# Patient Record
Sex: Female | Born: 1960 | Race: White | Hispanic: No | Marital: Married | Smoking: Never smoker
Health system: Southern US, Community
[De-identification: ages and names within clinical notes are randomized; demographics above are authoritative.]

## PROBLEM LIST (undated history)

## (undated) DIAGNOSIS — R3121 Asymptomatic microscopic hematuria: Secondary | ICD-10-CM

## (undated) DIAGNOSIS — Z8742 Personal history of other diseases of the female genital tract: Secondary | ICD-10-CM

## (undated) DIAGNOSIS — Z9889 Other specified postprocedural states: Secondary | ICD-10-CM

## (undated) HISTORY — DX: Asymptomatic microscopic hematuria: R31.21

## (undated) HISTORY — DX: Other specified postprocedural states: Z87.42

## (undated) HISTORY — DX: Personal history of other diseases of the female genital tract: Z98.890

---

## 2000-10-28 ENCOUNTER — Other Ambulatory Visit: Admission: RE | Admit: 2000-10-28 | Discharge: 2000-10-28 | Payer: Self-pay | Admitting: *Deleted

## 2000-12-17 HISTORY — PX: DILATION AND CURETTAGE OF UTERUS: SHX78

## 2001-01-09 ENCOUNTER — Ambulatory Visit (HOSPITAL_COMMUNITY): Admission: RE | Admit: 2001-01-09 | Discharge: 2001-01-09 | Payer: Self-pay | Admitting: *Deleted

## 2011-08-28 ENCOUNTER — Encounter: Payer: Self-pay | Admitting: Family Medicine

## 2011-08-28 ENCOUNTER — Ambulatory Visit (INDEPENDENT_AMBULATORY_CARE_PROVIDER_SITE_OTHER): Payer: No Typology Code available for payment source | Admitting: Family Medicine

## 2011-08-28 DIAGNOSIS — Z780 Asymptomatic menopausal state: Secondary | ICD-10-CM | POA: Insufficient documentation

## 2011-08-28 DIAGNOSIS — Z01419 Encounter for gynecological examination (general) (routine) without abnormal findings: Secondary | ICD-10-CM

## 2011-08-28 DIAGNOSIS — Z124 Encounter for screening for malignant neoplasm of cervix: Secondary | ICD-10-CM

## 2011-08-28 DIAGNOSIS — N951 Menopausal and female climacteric states: Secondary | ICD-10-CM

## 2011-08-28 MED ORDER — MEDROXYPROGESTERONE ACETATE 2.5 MG PO TABS: 2.5000 mg | ORAL_TABLET | Freq: Every day | ORAL | Status: DC

## 2011-08-28 MED ORDER — ESTROGENS, CONJUGATED 0.625 MG/GM VA CREA: TOPICAL_CREAM | Freq: Every day | VAGINAL | Status: AC

## 2011-08-28 NOTE — Progress Notes (Signed)
  Subjective:     Karina Walsh is a 50 y.o. female and is here for a comprehensive physical exam. The patient reports problems - menopausal symptoms with vaginal dryness, no cycle x 2 yrs, painful intercourse and some hot flashes..  History   Social History  . Marital Status: Married    Spouse Name: N/A    Number of Children: N/A  . Years of Education: N/A   Occupational History  . Not on file.   Social History Main Topics  . Smoking status: Passive Smoker    Types: Cigarettes  . Smokeless tobacco: Not on file  . Alcohol Use: No  . Drug Use: No  . Sexually Active: Not on file   Other Topics Concern  . Not on file   Social History Narrative  . No narrative on file   No health maintenance topics applied.  The following portions of the patient's history were reviewed and updated as appropriate: allergies, current medications, past family history, past medical history, past social history, past surgical history and problem list.  Review of Systems A comprehensive review of systems was negative.   Objective:    BP 109/71  Pulse 79  Ht 5\' 3"  (1.6 m)  Wt 132 lb (59.875 kg)  BMI 23.38 kg/m2 General appearance: alert, cooperative and appears stated age Neck: no adenopathy, supple, symmetrical, trachea midline and thyroid not enlarged, symmetric, no tenderness/mass/nodules Back: negative, symmetric, no curvature. ROM normal. No CVA tenderness. Lungs: clear to auscultation bilaterally Breasts: normal appearance, no masses or tenderness Heart: regular rate and rhythm, S1, S2 normal, no murmur, click, rub or gallop Abdomen: soft, non-tender; bowel sounds normal; no masses,  no organomegaly Pelvic: cervix normal in appearance, external genitalia normal, no adnexal masses or tenderness, no cervical motion tenderness, uterus normal size, shape, and consistency and vagina normal without discharge Extremities: extremities normal, atraumatic, no cyanosis or edema Pulses: 2+ and  symmetric Skin: Skin color, texture, turgor normal. No rashes or lesions Lymph nodes: Cervical, supraclavicular, and axillary nodes normal. Neurologic: Grossly normal    Assessment:    Healthy female exam. Menopause     Plan:    Pap smear, mammogram, Blood work Trial of premarin and provera. See After Visit Summary for Counseling Recommendations

## 2011-08-28 NOTE — Progress Notes (Signed)
Here for yearly exam and to discuss HRT.

## 2011-08-28 NOTE — Patient Instructions (Signed)
Insomnia Insomnia is frequent trouble falling and/or staying asleep. Insomnia can be a long term problem or a short term problem. Both are common. Insomnia can be a short term problem when the wakefulness is related to a certain stress or worry. Long term insomnia is often related to ongoing stress during waking hours and/or poor sleeping habits. Overtime, sleep deprivation itself can make the problem worse. Every little thing feels more severe because you are overtired and your ability to cope is decreased. SYMPTOMS  Not feeling rested in the morning.   Anxiety and restlessness at bedtime.   Difficulty falling and staying asleep.  CAUSES  Stress, anxiety, and depression.   Poor sleeping habits.   Distractions such as TV in the bedroom.   Naps close to bedtime.   Engaging in emotionally charged conversations before bed.   Technical reading before sleep.   Alcohol and other sedatives. They may make the problem worse. They can hurt normal sleep patterns and normal dream activity.   Stimulants such as caffeine for several hours prior to bedtime.   Pain syndromes and shortness of breath can cause insomnia.   Exercise late at night.   Changing time zones may cause sleeping problems (jet lag).  It is sometimes helpful to have someone observe your sleeping patterns. They should look for periods of not breathing during the night (sleep apnea). They should also look to see how long those periods last. If you live alone or observers are uncertain, you can also be observed at a sleep clinic where your sleep patterns will be professionally monitored. Sleep apnea requires a checkup and treatment. Give your caregivers your medical history. Give your caregivers observations your family has made about your sleep.  TREATMENT  Your caregiver may prescribe treatment for an underlying medical disorders. Your caregiver can give advice or help if you are using alcohol or other drugs for self-medication.  Treatment of underlying problems will usually eliminate insomnia problems.   Medications can be prescribed for short time use. They are generally not recommended for lengthy use.   Over-the-counter sleep medicines are not recommended for lengthy use. They can be habit forming.   You can promote easier sleeping by making lifestyle changes such as:   Using relaxation techniques that help with breathing and reduce muscle tension.   Exercising earlier in the day.   Changing your diet and the time of your last meal. No night time snacks.   Establish a regular time to go to bed.   Counseling can help with stressful problems and worry.   Soothing music and white noise may be helpful if there are background noises you cannot remove.   Stop tedious detailed work at least one hour before bedtime.  HOME CARE INSTRUCTIONS  Keep a diary. Inform your caregiver about your progress. This includes any medication side effects. See your caregiver regularly. Take note of:   Times when you are asleep.  Times when you are awake during the night.   The quality of your sleep.  How you feel the next day.   This information will help your caregiver care for you.  Get out of bed if you are still awake after 15 minutes. Read or do some quiet activity. Keep the lights down. Wait until you feel sleepy and go back to bed.   Keep regular sleeping and waking hours. Avoid naps.   Exercise regularly.   Avoid distractions at bedtime. Distractions include watching television or engaging in any intense or detailed  activity like attempting to balance the household checkbook.   Develop a bedtime ritual. Keep a familiar routine of bathing, brushing your teeth, climbing into bed at the same time each night, listening to soothing music. Routines increase the success of falling to sleep faster.   Use relaxation techniques. This can be using breathing and muscle tension release routines. It can also include  visualizing peaceful scenes. You can also help control troubling or intruding thoughts by keeping your mind occupied with boring or repetitive thoughts like the old concept of counting sheep. You can make it more creative like imagining planting one beautiful flower after another in your backyard garden.   During your day, work to eliminate stress. When this is not possible use some of the previous suggestions to help reduce the anxiety that accompanies stressful situations.  MAKE SURE YOU:   Understand these instructions.   Will watch your condition.   Will get help right away if you are not doing well or get worse.  Document Released: 11/30/2000 Document Re-Released: 11/15/2008 [facility] [facility] Patient Information 2011 [place], [place].Menopause Menopause is the normal time of life when menstrual periods stop completely. Menopause is complete when you have missed 12 consecutive menstrual periods. It usually occurs between the ages of 75-55, with an average age of 7. Very rarely does a woman develop menopause before 50 years old. At menopause, your ovaries stop producing the female hormones, estrogen and progesterone. This can cause undesirable symptoms and also affect your health. Sometimes the symptoms may occur 4-5 years before the menopause begins. There is no relationship between oral contraceptives, number of children you had, race or the age your menstrual periods started (menarche). Heavy smokers and very thin women may develop menopause earlier in life. CAUSE  The ovaries stop producing the female hormones estrogen and progesterone.   Other causes include:   Surgery to remove both ovaries.  The ovaries stop functioning for no known reason.   Tumors of the pituitary gland in the brain.   Medical disease that affects the ovaries and hormone production.  Radiation treatment to the abdomen or pelvis.   Chemotherapy that affects the ovaries.   SYMPTOMS  Hot flashes.  Night sweats.    Decrease in sex drive.   Vaginal dryness and shrinking of the size of the genital organs causing painful intercourse.   Dryness of the skin and developing wrinkles.  Headaches.   Tiredness.   Irritability.   Memory problems.  Weight gain.   Bladder infections.   Hair growth of the face and chest.   Infertility.   More serious symptoms include:  Loss of bone (osteoporosis) causing breaks (fractures).   Depression.   Hardening and narrowing of the arteries (atherosclerosis) causing heart attacks and strokes.  DIAGNOSIS  When the menstrual periods have stopped for 12 straight months.   Physical exam.   Hormone studies of the blood.  TREATMENT There are many treatment choices and nearly as many questions about them. The decisions to treat or not to treat menopausal changes is an individual decision made with your caregiver. Your caregiver can discuss the treatments with you. Together, you can decide which treatment will work best for you such as:  Hormone replacement treatment.  Treat the individual symptoms with medication (ex. tranquilizer for depression).   Herbal medications that may help specific symptoms.  Counseling by a psychiatrist or psychologist.   Group therapy.   No treatment.   HOME CARE INSTRUCTIONS  Take the medication your caregiver gives you  as directed.   Get plenty of sleep and rest.   Exercise regularly.   Eat a diet that contains calcium (good for the bones) and soy products (acts like estrogen hormone).   Avoid alcoholic beverages.   Do not smoke.   Taking vitamin E may help in certain cases.   If you have hot flashes, dress in layers.   Take supplements, calcium and vitamin D to strengthen bones.   You can use over-the-counter vaginal cream for vaginal dryness.   Group therapy is sometimes very helpful.   Acupuncture may be helpful in some cases.  SEEK MEDICAL CARE IF:  You are not sure you are in the menopause.   You  are having menopausal symptoms and need advice and treatment.   You are still having menstrual periods after age 27.   You have pain with intercourse.   You are in the menopause (no menstrual period for 12 months) and develop vaginal bleeding.   You need a referral to a specialist (gynecologist, psychiatrist or psychologist) for treatment.  SEEK IMMEDIATE MEDICAL CARE IF:  You have severe depression.   You have excessive vaginal bleeding.   You fell and think you have a broken bone.   You have pain when you urinate.   You develop leg or chest pain.   You have a fast pounding heart beat (palpitations).   You have severe headaches.   You develop vision problems.   You feel a lump in your breast.   You have abdominal pain or severe indigestion.  Document Released: 02/23/2004 Document Re-Released: 10/05/2008 [facility] Patient Information 2011 [place], [place].

## 2011-08-31 ENCOUNTER — Other Ambulatory Visit (INDEPENDENT_AMBULATORY_CARE_PROVIDER_SITE_OTHER): Payer: No Typology Code available for payment source | Admitting: *Deleted

## 2011-08-31 DIAGNOSIS — N951 Menopausal and female climacteric states: Secondary | ICD-10-CM

## 2011-08-31 DIAGNOSIS — Z01419 Encounter for gynecological examination (general) (routine) without abnormal findings: Secondary | ICD-10-CM

## 2011-09-01 LAB — COMPREHENSIVE METABOLIC PANEL
ALT: 13 U/L (ref 0–35)
AST: 17 U/L (ref 0–37)
Chloride: 105 mEq/L (ref 96–112)
Creat: 0.97 mg/dL (ref 0.50–1.10)
Sodium: 140 mEq/L (ref 135–145)
Total Bilirubin: 0.9 mg/dL (ref 0.3–1.2)

## 2011-09-01 LAB — CBC WITH DIFFERENTIAL/PLATELET
Basophils Absolute: 0 10*3/uL (ref 0.0–0.1)
Eosinophils Absolute: 0.1 10*3/uL (ref 0.0–0.7)
Eosinophils Relative: 1 % (ref 0–5)
Lymphocytes Relative: 27 % (ref 12–46)
MCV: 93.1 fL (ref 78.0–100.0)
Neutrophils Relative %: 64 % (ref 43–77)
Platelets: 236 10*3/uL (ref 150–400)
RDW: 13.1 % (ref 11.5–15.5)
WBC: 7.1 10*3/uL (ref 4.0–10.5)

## 2011-09-01 LAB — LIPID PANEL
Cholesterol: 215 mg/dL — ABNORMAL HIGH (ref 0–200)
HDL: 49 mg/dL (ref >39)
Total CHOL/HDL Ratio: 4.4 Ratio
Triglycerides: 106 mg/dL (ref <150)

## 2011-09-02 ENCOUNTER — Encounter: Payer: Self-pay | Admitting: Family Medicine

## 2011-09-06 ENCOUNTER — Ambulatory Visit (HOSPITAL_COMMUNITY)
Admission: RE | Admit: 2011-09-06 | Discharge: 2011-09-06 | Disposition: A | Discharge status: Home / Self Care | Payer: No Typology Code available for payment source | Source: Ambulatory Visit | Attending: Family Medicine | Admitting: Family Medicine

## 2011-09-06 DIAGNOSIS — Z1231 Encounter for screening mammogram for malignant neoplasm of breast: Secondary | ICD-10-CM | POA: Insufficient documentation

## 2011-09-06 DIAGNOSIS — Z01419 Encounter for gynecological examination (general) (routine) without abnormal findings: Secondary | ICD-10-CM

## 2015-08-17 ENCOUNTER — Encounter: Payer: Self-pay | Admitting: Family Medicine

## 2015-08-17 ENCOUNTER — Other Ambulatory Visit (HOSPITAL_COMMUNITY)
Admission: RE | Admit: 2015-08-17 | Discharge: 2015-08-17 | Disposition: A | Discharge status: Home / Self Care | Payer: No Typology Code available for payment source | Source: Ambulatory Visit | Attending: Family Medicine | Admitting: Family Medicine

## 2015-08-17 ENCOUNTER — Ambulatory Visit (INDEPENDENT_AMBULATORY_CARE_PROVIDER_SITE_OTHER): Payer: No Typology Code available for payment source | Admitting: Family Medicine

## 2015-08-17 VITALS — BP 106/70 | HR 64 | Ht 63.0 in | Wt 136.2 lb

## 2015-08-17 DIAGNOSIS — Z1211 Encounter for screening for malignant neoplasm of colon: Secondary | ICD-10-CM

## 2015-08-17 DIAGNOSIS — Z83438 Family history of other disorder of lipoprotein metabolism and other lipidemia: Secondary | ICD-10-CM

## 2015-08-17 DIAGNOSIS — N951 Menopausal and female climacteric states: Secondary | ICD-10-CM

## 2015-08-17 DIAGNOSIS — Z0000 Encounter for general adult medical examination without abnormal findings: Secondary | ICD-10-CM | POA: Diagnosis not present

## 2015-08-17 DIAGNOSIS — Z01419 Encounter for gynecological examination (general) (routine) without abnormal findings: Secondary | ICD-10-CM | POA: Insufficient documentation

## 2015-08-17 DIAGNOSIS — N941 Dyspareunia: Secondary | ICD-10-CM | POA: Diagnosis not present

## 2015-08-17 DIAGNOSIS — Z7189 Other specified counseling: Secondary | ICD-10-CM

## 2015-08-17 DIAGNOSIS — Z8349 Family history of other endocrine, nutritional and metabolic diseases: Secondary | ICD-10-CM

## 2015-08-17 DIAGNOSIS — Z1151 Encounter for screening for human papillomavirus (HPV): Secondary | ICD-10-CM | POA: Diagnosis present

## 2015-08-17 DIAGNOSIS — Z7689 Persons encountering health services in other specified circumstances: Secondary | ICD-10-CM

## 2015-08-17 DIAGNOSIS — IMO0002 Reserved for concepts with insufficient information to code with codable children: Secondary | ICD-10-CM

## 2015-08-17 LAB — POCT URINALYSIS DIPSTICK
BILIRUBIN UA: NEGATIVE
GLUCOSE UA: NEGATIVE
KETONES UA: NEGATIVE
LEUKOCYTES UA: NEGATIVE
NITRITE UA: NEGATIVE
PH UA: 6
Protein, UA: NEGATIVE
Spec Grav, UA: 1.02
Urobilinogen, UA: NEGATIVE

## 2015-08-17 LAB — POCT UA - MICROSCOPIC ONLY

## 2015-08-17 NOTE — Patient Instructions (Addendum)
Remember to get a Hepatitis C screening test. We referred for colonoscopy and mammogram today.   Preventative Care for Adults - Female      MAINTAIN REGULAR HEALTH EXAMS:  A routine yearly physical is a good way to check in with your primary care provider about your health and preventive screening. It is also an opportunity to share updates about your health and any concerns you have, and receive a thorough all-over exam.   Most health insurance companies pay for at least some preventative services.  Check with your health plan for specific coverages.  WHAT PREVENTATIVE SERVICES DO WOMEN NEED?  Adult women should have their weight and blood pressure checked regularly.   Women age 39 and older should have their cholesterol levels checked regularly.  Women should be screened for cervical cancer with a Pap smear and pelvic exam beginning at either age 56, or 3 years after they become sexually activity.    Breast cancer screening generally begins at age 56 with a mammogram and breast exam by your primary care provider.    Beginning at age 69 and continuing to age 72, women should be screened for colorectal cancer.  Certain people may need continued testing until age 60.  Updating vaccinations is part of preventative care.  Vaccinations help protect against diseases such as the flu.  Osteoporosis is a disease in which the bones lose minerals and strength as we age. Women ages 71 and over should discuss this with their caregivers, as should women after menopause who have other risk factors.  Lab tests are generally done as part of preventative care to screen for anemia and blood disorders, to screen for problems with the kidneys and liver, to screen for bladder problems, to check blood sugar, and to check your cholesterol level.  Preventative services generally include counseling about diet, exercise, avoiding tobacco, drugs, excessive alcohol consumption, and sexually transmitted infections.     GENERAL RECOMMENDATIONS FOR GOOD HEALTH:  Healthy diet:  Eat a variety of foods, including fruit, vegetables, animal or vegetable protein, such as meat, fish, chicken, and eggs, or beans, lentils, tofu, and grains, such as rice.  Drink plenty of water daily.  Decrease saturated fat in the diet, avoid lots of red meat, processed foods, sweets, fast foods, and fried foods.  Exercise:  Aerobic exercise helps maintain good heart health. At least 30-40 minutes of moderate-intensity exercise is recommended. For example, a brisk walk that increases your heart rate and breathing. This should be done on most days of the week.   Find a type of exercise or a variety of exercises that you enjoy so that it becomes a part of your daily life.  Examples are running, walking, swimming, water aerobics, and biking.  For motivation and support, explore group exercise such as aerobic class, spin class, Zumba, Yoga,or  martial arts, etc.    Set exercise goals for yourself, such as a certain weight goal, walk or run in a race such as a 5k walk/run.  Speak to your primary care provider about exercise goals.  Disease prevention:  If you smoke or chew tobacco, find out from your caregiver how to quit. It can literally save your life, no matter how long you have been a tobacco user. If you do not use tobacco, never begin.   Maintain a healthy diet and normal weight. Increased weight leads to problems with blood pressure and diabetes.   The Body Mass Index or BMI is a way of measuring how  much of your body is fat. Having a BMI above 27 increases the risk of heart disease, diabetes, hypertension, stroke and other problems related to obesity. Your caregiver can help determine your BMI and based on it develop an exercise and dietary program to help you achieve or maintain this important measurement at a healthful level.  High blood pressure causes heart and blood vessel problems.  Persistent high blood pressure  should be treated with medicine if weight loss and exercise do not work.   Fat and cholesterol leaves deposits in your arteries that can block them. This causes heart disease and vessel disease elsewhere in your body.  If your cholesterol is found to be high, or if you have heart disease or certain other medical conditions, then you may need to have your cholesterol monitored frequently and be treated with medication.   Ask if you should have a cardiac stress test if your history suggests this. A stress test is a test done on a treadmill that looks for heart disease. This test can find disease prior to there being a problem.  Menopause can be associated with physical symptoms and risks. Hormone replacement therapy is available to decrease these. You should talk to your caregiver about whether starting or continuing to take hormones is right for you.   Osteoporosis is a disease in which the bones lose minerals and strength as we age. This can result in serious bone fractures. Risk of osteoporosis can be identified using a bone density scan. Women ages 62 and over should discuss this with their caregivers, as should women after menopause who have other risk factors. Ask your caregiver whether you should be taking a calcium supplement and Vitamin D, to reduce the rate of osteoporosis.   Avoid drinking alcohol in excess (more than two drinks per day).  Avoid use of street drugs. Do not share needles with anyone. Ask for professional help if you need assistance or instructions on stopping the use of alcohol, cigarettes, and/or drugs.  Brush your teeth twice a day with fluoride toothpaste, and floss once a day. Good oral hygiene prevents tooth decay and gum disease. The problems can be painful, unattractive, and can cause other health problems. Visit your dentist for a routine oral and dental check up and preventive care every 6-12 months.   Look at your skin regularly.  Use a mirror to look at your back.  Notify your caregivers of changes in moles, especially if there are changes in shapes, colors, a size larger than a pencil eraser, an irregular border, or development of new moles.  Safety:  Use seatbelts 100% of the time, whether driving or as a passenger.  Use safety devices such as hearing protection if you work in environments with loud noise or significant background noise.  Use safety glasses when doing any work that could send debris in to the eyes.  Use a helmet if you ride a bike or motorcycle.  Use appropriate safety gear for contact sports.  Talk to your caregiver about gun safety.  Use sunscreen with a SPF (or skin protection factor) of 15 or greater.  Lighter skinned people are at a greater risk of skin cancer. Don't forget to also wear sunglasses in order to protect your eyes from too much damaging sunlight. Damaging sunlight can accelerate cataract formation.   Practice safe sex. Use condoms. Condoms are used for birth control and to help reduce the spread of sexually transmitted infections (or STIs).  Some of the STIs  are gonorrhea (the clap), chlamydia, syphilis, trichomonas, herpes, HPV (human papilloma virus) and HIV (human immunodeficiency virus) which causes AIDS. The herpes, HIV and HPV are viral illnesses that have no cure. These can result in disability, cancer and death.   Keep carbon monoxide and smoke detectors in your home functioning at all times. Change the batteries every 6 months or use a model that plugs into the wall.   Vaccinations:  Stay up to date with your tetanus shots and other required immunizations. You should have a booster for tetanus every 10 years. Be sure to get your flu shot every year, since 5%-20% of the [place] population comes down with the flu. The flu vaccine changes each year, so being vaccinated once is not enough. Get your shot in the fall, before the flu season peaks.   Other vaccines to consider:  Human Papilloma Virus or HPV causes cancer of  the cervix, and other infections that can be transmitted from person to person. There is a vaccine for HPV, and females should get immunized between the ages of 87 and 39. It requires a series of 3 shots.   Pneumococcal vaccine to protect against certain types of pneumonia.  This is normally recommended for adults age 66 or older.  However, adults younger than 54 years old with certain underlying conditions such as diabetes, heart or lung disease should also receive the vaccine.  Shingles vaccine to protect against Varicella Zoster if you are older than age 54, or younger than 54 years old with certain underlying illness.  Hepatitis A vaccine to protect against a form of infection of the liver by a virus acquired from food.  Hepatitis B vaccine to protect against a form of infection of the liver by a virus acquired from blood or body fluids, particularly if you work in health care.  If you plan to travel internationally, check with your local health department for specific vaccination recommendations.  Cancer Screening:  Breast cancer screening is essential to preventive care for women. All women age 46 and older should perform a breast self-exam every month. At age 54 and older, women should have their caregiver complete a breast exam each year. Women at ages 46 and older should have a mammogram (x-ray film) of the breasts. Your caregiver can discuss how often you need mammograms.    Cervical cancer screening includes taking a Pap smear (sample of cells examined under a microscope) from the cervix (end of the uterus). It also includes testing for HPV (Human Papilloma Virus, which can cause cervical cancer). Screening and a pelvic exam should begin at age 28, or 3 years after a woman becomes sexually active. Screening should occur every year, with a Pap smear but no HPV testing, up to age 30. After age 43, you should have a Pap smear every 3 years with HPV testing, if no HPV was found previously.    Most routine colon cancer screening begins at the age of 20. On a yearly basis, doctors may provide special easy to use take-home tests to check for hidden blood in the stool. Sigmoidoscopy or colonoscopy can detect the earliest forms of colon cancer and is life saving. These tests use a small camera at the end of a tube to directly examine the colon. Speak to your caregiver about this at age 51, when routine screening begins (and is repeated every 5 years unless early forms of pre-cancerous polyps or small growths are found).   Alert and in no distress. Tympanic  membranes and canals are normal. Pharyngeal area is normal. Neck is supple without adenopathy or thyromegaly. Cardiac exam shows a regular sinus rhythm without murmurs or gallops. Lungs are clear to auscultation.

## 2015-08-17 NOTE — Progress Notes (Signed)
Subjective:    Patient ID: Karina Walsh, female    DOB: [birth year], 54 y.o.   MRN: [id number]  HPI She is here to establish primary care and for a complete physical exam. Her complaints are vaginal dryness and dyspareunia. She has topical estrogen at home for this. She recently had blood work done through [employer] since her work pays for this. She also received a flu shot. She has occasional hot flashes but not bothersome. Occasional constipation then diarrhea, takes probiotics and not an issue mostly.  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 Immunizations- UTD. Has not been screened for Hep C ever. Health maintenance: mammogram- 4 years ago, colonoscopy- never, dentis- yes, eyes- yes   Other providers: none  Reviewed past medical, social, family history.   Review of Systems    Review of Systems Constitutional: -fever, -chills, -sweats, -unexpected weight change, -anorexia, -fatigue Allergy: -sneezing, -itching, -congestion Dermatology: denies changing moles, rash, lumps, new worrisome lesions ENT: -runny nose, -ear pain, -sore throat, -hoarseness, -sinus pain, -teeth pain, -tinnitus, -hearing loss, -epistaxis Cardiology:  -chest pain, -palpitations, -edema, -orthopnea, -paroxysmal nocturnal dyspnea Respiratory: -cough, -shortness of breath, -dyspnea on exertion, -wheezing, -hemoptysis Gastroenterology: -abdominal pain, -nausea, -vomiting, occasional diarrhea, occasional constipation, -blood in stool, -changes in bowel movement, -dysphagia Hematology: -bleeding or bruising problems Musculoskeletal: -arthralgias, -myalgias, -joint swelling, -back pain, -neck pain, -cramping, -gait changes Ophthalmology: -vision changes, -eye redness, -itching, -discharge Urology: -dysuria, -difficulty urinating, -hematuria, -urinary frequency, -urgency, incontinence Neurology: -headache, -weakness, -tingling, -numbness,  -speech abnormality, -memory loss, -falls, -dizziness Psychology:  -depressed mood, -agitation, -sleep problems   Objective:   Physical Exam  BP 106/70 mmHg  Pulse 64  Ht 5\' 3"  (1.6 m)  Wt 136 lb 3.2 oz (61.78 kg)  BMI 24.13 kg/m2  General Appearance:    Alert, cooperative, no distress, appears stated age  Head:    Normocephalic, without obvious abnormality, atraumatic  Eyes:    PERRL, conjunctiva/corneas clear, EOM's intact, fundi    benign  Ears:    Normal TM's and external ear canals  Nose:   Nares normal, mucosa normal, no drainage or sinus   tenderness  Throat:   Lips, mucosa, and tongue normal; teeth and gums normal  Neck:   Supple, no lymphadenopathy;  thyroid:  no   enlargement/tenderness/nodules; no carotid   bruit   Back:    Spine nontender, no curvature, ROM normal, no CVA     tenderness  Lungs:     Clear to auscultation bilaterally without wheezes, rales or     ronchi; respirations unlabored  Chest Wall:    No tenderness or deformity   Heart:    Regular rate and rhythm, S1 and S2 normal, no murmur, rub   or gallop  Breast Exam:    No tenderness, masses, or nipple discharge or inversion.      No axillary lymphadenopathy  Abdomen:     Soft, non-tender, nondistended, normoactive bowel sounds,    no masses, no hepatosplenomegaly  Genitalia:    Normal external genitalia without lesions. Vaginal dryness and mild atrophy; cervix without lesions, or cervical motion tenderness. No abnormal vaginal discharge.  Uterus and adnexa not enlarged, nontender, no masses.  Pap performed  Rectal:    Deferred, going for colonoscopy  Extremities:   No clubbing, cyanosis or edema  Pulses:   2+ and symmetric all extremities  Skin:   Skin color, texture, turgor normal, no rashes or lesions  Lymph nodes:   Cervical, supraclavicular, and axillary nodes normal  Neurologic:   CNII-XII intact, normal strength, sensation and gait; reflexes 2+ and symmetric throughout          Psych:   Normal mood,  affect, hygiene and grooming.    Urine dipstick trace of blood, urine microscopy showed no RBCs.  EKG with left axis deviation and incomplete RBBB, reviewed in conjunction with [place], [place].     Assessment & Plan:  Routine general medical examination at a health care facility - Plan: POCT urinalysis dipstick, EKG 12-Lead, Cytology - PAP, POCT UA - Microscopic Only  Vaginal dryness, menopausal  Family history of hyperlipidemia  Screening for colon cancer - Plan: Ambulatory referral to Gastroenterology  Dyspareunia  Encounter to establish care  Reviewed labs with her that she brought from [facility]. Discussed that Creatine was marginally elevated and we will keep an eye on this. Also her cholesterol and LDL were slightly elevated and discussed low fat, low cholesterol diet and we will follow up on this as well. She will call and schedule mammogram. Recommend that she get a colonoscopy and lipids checked due to family history. She would like to get lab work through [facility] and will get results sent to me. She will also get Hep C screening as recommended for her age group.  Discussed that she can continue using topical estrogen as needed for vaginal dryness and pain with intercourse. She will let me know if she needs a refill. She had a small amount of blood in urinalysis dipstick but none found under microscope. Dr. Lynelle Doctor also looked at urine under microscope. Suspect that this is chemical error or possibly related to periurethral dryness. Discussed eating a well balanced diet and getting a minimum of 150 minutes of moderate intensity exercise per week. Assured her that baseline EKG today was not worrisome but did this due to family history of heart disease.

## 2015-08-18 NOTE — Addendum Note (Signed)
Addended by: Herminio Commons A on: 08/18/2015 08:14 AM   Modules accepted: Orders

## 2015-08-19 LAB — CYTOLOGY - PAP

## 2015-08-23 ENCOUNTER — Other Ambulatory Visit: Payer: Self-pay | Admitting: Family Medicine

## 2015-08-23 ENCOUNTER — Ambulatory Visit (HOSPITAL_COMMUNITY)
Admission: RE | Admit: 2015-08-23 | Discharge: 2015-08-23 | Disposition: A | Discharge status: Home / Self Care | Payer: No Typology Code available for payment source | Source: Ambulatory Visit | Attending: Family Medicine | Admitting: Family Medicine

## 2015-08-23 DIAGNOSIS — Z1231 Encounter for screening mammogram for malignant neoplasm of breast: Secondary | ICD-10-CM | POA: Diagnosis present

## 2015-08-23 DIAGNOSIS — Z0000 Encounter for general adult medical examination without abnormal findings: Secondary | ICD-10-CM

## 2015-08-29 ENCOUNTER — Encounter: Payer: Self-pay | Admitting: Family Medicine

## 2015-11-07 ENCOUNTER — Telehealth: Payer: Self-pay | Admitting: Family Medicine

## 2015-11-07 NOTE — Telephone Encounter (Signed)
Pt called and asked copy of office visit to be faxed to her at work [phone]

## 2016-01-26 ENCOUNTER — Telehealth: Payer: Self-pay | Admitting: Family Medicine

## 2016-01-26 NOTE — Telephone Encounter (Signed)
Called patient, she will check with her insurance regarding formulary.  Also she will consider the OTC moisturizers.    She will let us know.

## 2016-01-26 NOTE — Telephone Encounter (Signed)
Patient called requesting refill or samples of premarin cream.  We do not have any samples.  She will need a written rx as she is trying to find it cheaper.  The cost is $150.00 per rx on her insurance.  Pt asked is there a cheaper therapy? Return written rx to [facility]

## 2016-01-26 NOTE — Telephone Encounter (Signed)
Has she tried non-hormonal vaginal moisturizers in the past like Replens, Vagisil feminine moisturizer or [id number]? If not she might want to try these over the counter daily for 2 weeks then 2-3 times per week as needed. Another option is asking her insurance if they would cover Estrace vaginal cream or Vagifem tablets.  Do you want to call her or should I ask Direse?thanks.

## 2016-03-08 ENCOUNTER — Telehealth: Payer: Self-pay | Admitting: Family Medicine

## 2016-03-08 NOTE — Telephone Encounter (Signed)
Pt needs refill Premarin to [facility] on [address]

## 2016-03-09 MED ORDER — ESTROGENS, CONJUGATED 0.625 MG/GM VA CREA: 1.0000 | TOPICAL_CREAM | VAGINAL | Status: DC

## 2016-03-09 NOTE — Telephone Encounter (Signed)
Spoke to patient and she states Dr. Jeannetta NapElkins last filled this but she uses topical premarin 6.25% about twice a week. She had her mammogram and it was negative (in epic) she was advised we will refill it until august. But then she is due for her annual. Please send in med

## 2016-03-09 NOTE — Telephone Encounter (Signed)
Refilled until august 

## 2016-03-09 NOTE — Telephone Encounter (Signed)
Okay to refill Premarin topical until her next visit.

## 2016-03-09 NOTE — Telephone Encounter (Signed)
Is she requesting topical Premarin? I have not prescribed this for her in the past, this was prescribed by her Gynecologist. Please ask how often is she using it? Did she go for her mammogram as we discussed at her last visit? If not, I would like for her to do this soon, my records show she has never had one, is this correct? I can refill until August but that will be 1 year since our last visit and she will need to come in for her annual.

## 2017-03-04 ENCOUNTER — Telehealth: Payer: Self-pay | Admitting: Family Medicine

## 2017-03-04 NOTE — Telephone Encounter (Signed)
She is overdue for her annual. Let's have her come in for a visit.

## 2017-03-04 NOTE — Telephone Encounter (Signed)
Called patient, reached her voice mail [email or web address].

## 2017-03-04 NOTE — Telephone Encounter (Signed)
Pt called and would like to try Yuvafem 10mg  vaginal tablet instead of the Premarin cream.  Her pharmacy is [facility] at [address]  Pt ph [phone]

## 2017-03-11 NOTE — Telephone Encounter (Signed)
Pt made an appt for this week.

## 2017-03-12 DIAGNOSIS — Z0000 Encounter for general adult medical examination without abnormal findings: Secondary | ICD-10-CM | POA: Insufficient documentation

## 2017-03-12 NOTE — Progress Notes (Signed)
Subjective:    Patient ID: Karina Walsh, female    DOB: 02-20-1961, 56 y.o.   MRN: [id number]  HPI Chief Complaint  Patient presents with  . fasting cpe    fasting cpe, pap done 2016- normal   She is new to the practice and here for a complete physical exam. Reports occasional soft, loose stools for a few days and then normal bowel movements for a period of time. This has been ongoing for months and has not gotten worse.  Has flares every few weeks. No pain or N/V. Has not tried avoiding foods or tried any medication for this.  Vaginal dryness causing occasional dyspareunia. Has been using Premarin as needed. Would like to try Yuvafem tablets instead.   Other providers: none  Social history: Lives with husband, works as Engineer, site  Smoking, drinking alcohol, drug use  Diet: fairly healthy and is cutting back portions Excerise: nothing but planning start    Immunizations: up to date   Health maintenance:  Mammogram: 08/2015 Colonoscopy: never (did not go in 2016)  Last Gynecological Exam: 07/2015 normal pap smear and neg HPV Last Menstrual cycle: 5 years ago Last Dental Exam: has it scheduled.  Last Eye Exam: 2-3 years ago.   Wears seatbelt always, uses sunscreen, smoke detectors in home and functioning, does not text while driving and feels safe in home environment.   Reviewed allergies, medications, past medical, surgical, family, and social history.   Review of Systems Review of Systems Constitutional: -fever, -chills, -sweats, -unexpected weight change,-fatigue ENT: -runny nose, -ear pain, -sore throat Cardiology:  -chest pain, -palpitations, -edema Respiratory: -cough, -shortness of breath, -wheezing Gastroenterology: -abdominal pain, -nausea, -vomiting, -diarrhea, -constipation  Hematology: -bleeding or bruising problems Musculoskeletal: -arthralgias, -myalgias, -joint swelling, -back pain Ophthalmology: -vision changes Urology: -dysuria, -difficulty  urinating, -hematuria, -urinary frequency, -urgency Neurology: -headache, -weakness, -tingling, -numbness       Objective:   Physical Exam BP 110/72   Pulse 84   Ht 5' 2.75" (1.594 m)   Wt 139 lb 9.6 oz (63.3 kg)   BMI 24.93 kg/m   General Appearance:    Alert, cooperative, no distress, appears stated age  Head:    Normocephalic, without obvious abnormality, atraumatic  Eyes:    PERRL, conjunctiva/corneas clear, EOM's intact, fundi    benign  Ears:    Normal TM's and external ear canals  Nose:   Nares normal, mucosa normal, no drainage or sinus   tenderness  Throat:   Lips, mucosa, and tongue normal; teeth and gums normal  Neck:   Supple, no lymphadenopathy;  thyroid:  no   enlargement/tenderness/nodules; no carotid   bruit or JVD  Back:    Spine nontender, no curvature, ROM normal, no CVA     tenderness  Lungs:     Clear to auscultation bilaterally without wheezes, rales or     ronchi; respirations unlabored  Chest Wall:    No tenderness or deformity   Heart:    Regular rate and rhythm, S1 and S2 normal, no murmur, rub   or gallop  Breast Exam:    Declined, sent for mammogram.   Abdomen:     Soft, non-tender, nondistended, normoactive bowel sounds,    no masses, no hepatosplenomegaly  Genitalia:    Normal external genitalia without lesions.  BUS normal vagina dry otherwise normal; cervix without lesions, or cervical motion tenderness. No abnormal vaginal discharge.  Uterus and adnexa not enlarged, nontender, no masses.  Pap not performed, this  is up to date.    Rectal:    Deferred. Referred to GI.   Extremities:   No clubbing, cyanosis or edema  Pulses:   2+ and symmetric all extremities  Skin:   Skin color, texture, turgor normal, no rashes or lesions  Lymph nodes:   Cervical, supraclavicular, and axillary nodes normal  Neurologic:   CNII-XII intact, normal strength, sensation and gait; reflexes 2+ and symmetric throughout          Psych:   Normal mood, affect, hygiene and  grooming.    Urinalysis dipstick:  Spec grav 1.030, blood 1+      Assessment & Plan:  Routine general medical examination at a health care facility - Plan: Urinalysis Dipstick  Menopausal vaginal dryness  Hematuria, unspecified type - Plan: Urine Microscopic  Screening for breast cancer - Plan: MM DIGITAL SCREENING BILATERAL  Reviewed labs brought by patient with her. Blood work unremarkable including CBC, CMP, thyroid panel, lipid panel. Sent to be scanned.  She appears to be doing well overall. Encouraged her to start being more physically active and doing weight bearing exercise.  Discussed starting on vitamin D and getting adequate calcium in her diet.  She has vaginal dryness on exam and would like to be switched from vaginal cream Premarin to Vagifem tablets due to cost and messiness. Yuvafem sent to pharmacy.  She is positive for hematuria today and in the past. Plan to send for microscopic analysis.  Mammogram ordered.  She is low risk for osteoporosis per screening and we will discuss bone density again next year.  She did not get a colonoscopy as recommended in 2016. She is agreeable to do this and would like to go to the same GI as her husband at [facility]. She will call if she needs a referral.  Plan to follow up pending microscopic UA.

## 2017-03-13 ENCOUNTER — Encounter: Payer: Self-pay | Admitting: Family Medicine

## 2017-03-13 ENCOUNTER — Ambulatory Visit (INDEPENDENT_AMBULATORY_CARE_PROVIDER_SITE_OTHER): Payer: BC Managed Care – PPO | Admitting: Family Medicine

## 2017-03-13 VITALS — BP 110/72 | HR 84 | Ht 62.75 in | Wt 139.6 lb

## 2017-03-13 DIAGNOSIS — Z1231 Encounter for screening mammogram for malignant neoplasm of breast: Secondary | ICD-10-CM

## 2017-03-13 DIAGNOSIS — Z0000 Encounter for general adult medical examination without abnormal findings: Secondary | ICD-10-CM

## 2017-03-13 DIAGNOSIS — R319 Hematuria, unspecified: Secondary | ICD-10-CM | POA: Diagnosis not present

## 2017-03-13 DIAGNOSIS — N951 Menopausal and female climacteric states: Secondary | ICD-10-CM

## 2017-03-13 DIAGNOSIS — Z1239 Encounter for other screening for malignant neoplasm of breast: Secondary | ICD-10-CM

## 2017-03-13 LAB — POCT URINALYSIS DIPSTICK
Bilirubin, UA: NEGATIVE
GLUCOSE UA: NEGATIVE
Ketones, UA: NEGATIVE
Leukocytes, UA: NEGATIVE
NITRITE UA: NEGATIVE
PH UA: 6 (ref 5.0–8.0)
PROTEIN UA: NEGATIVE
SPEC GRAV UA: 1.03 (ref 1.030–1.035)
UROBILINOGEN UA: NEGATIVE (ref ?–2.0)

## 2017-03-13 LAB — URINALYSIS, MICROSCOPIC ONLY
Bacteria, UA: NONE SEEN [HPF]
Casts: NONE SEEN [LPF]
Crystals: NONE SEEN [HPF]
WBC UA: NONE SEEN WBC/HPF (ref <=5)
YEAST: NONE SEEN [HPF]

## 2017-03-13 MED ORDER — ESTRADIOL 10 MCG VA TABS: ORAL_TABLET | VAGINAL | 0 refills | Status: DC

## 2017-03-13 NOTE — Patient Instructions (Signed)

## 2017-03-15 ENCOUNTER — Other Ambulatory Visit: Payer: Self-pay | Admitting: Family Medicine

## 2017-03-15 DIAGNOSIS — Z1231 Encounter for screening mammogram for malignant neoplasm of breast: Secondary | ICD-10-CM

## 2017-04-02 ENCOUNTER — Encounter: Payer: Self-pay | Admitting: Family Medicine

## 2017-04-09 ENCOUNTER — Ambulatory Visit: Payer: No Typology Code available for payment source

## 2017-04-26 ENCOUNTER — Other Ambulatory Visit: Payer: Self-pay | Admitting: Family Medicine

## 2017-04-26 NOTE — Telephone Encounter (Signed)
Is this okay to refill? 

## 2017-04-26 NOTE — Telephone Encounter (Signed)
Ok

## 2017-05-06 ENCOUNTER — Telehealth: Payer: Self-pay | Admitting: Family Medicine

## 2017-05-06 MED ORDER — ESTRADIOL 10 MCG VA TABS: ORAL_TABLET | VAGINAL | 1 refills | Status: DC

## 2017-05-06 NOTE — Telephone Encounter (Signed)
Please take care of this-- thanks

## 2017-05-06 NOTE — Telephone Encounter (Signed)
done

## 2017-05-06 NOTE — Telephone Encounter (Signed)
Pt request 90 day supply for Yuvafem tablets due to copay increase.

## 2018-01-25 ENCOUNTER — Other Ambulatory Visit: Payer: Self-pay | Admitting: Family Medicine

## 2018-01-27 NOTE — Telephone Encounter (Signed)
ok 

## 2018-01-27 NOTE — Telephone Encounter (Signed)
Is this okay to refill? 

## 2018-02-23 ENCOUNTER — Other Ambulatory Visit: Payer: Self-pay | Admitting: Family Medicine

## 2018-02-24 NOTE — Telephone Encounter (Signed)
ok 

## 2018-02-24 NOTE — Telephone Encounter (Signed)
Pt has an appt on April 3rd for an cpe. It is okay to refill med

## 2018-03-19 ENCOUNTER — Encounter: Payer: BC Managed Care – PPO | Admitting: Family Medicine

## 2018-03-19 NOTE — Progress Notes (Signed)
Subjective:    Patient ID: Karina Walsh, female    DOB: November 10, 1961, 57 y.o.   MRN: [id number]  HPI Chief Complaint  Patient presents with  . Annual Exam    no concerns.   She is here for a complete physical exam. Last CPE: 02/2017  Other providers: none   No concerns or complaints.   States she is doing well.   Diet: fairly healthy Excerise: nothing lately   Immunizations: up to date.   Health maintenance:  Mammogram: 08/2015 (did not go last year)  Colonoscopy: never (did not get this done) Last Gynecological Exam: last pap smear 2016.  Last Menstrual cycle: 6 years ago  Last Dental Exam: annually  Last Eye Exam: years   Wears seatbelt always, uses sunscreen, smoke detectors in home and functioning, does not text while driving and feels safe in home environment.   Reviewed allergies, medications, past medical, surgical, family, and social history.    Review of Systems Review of Systems Constitutional: -fever, -chills, -sweats, -unexpected weight change,-fatigue ENT: -runny nose, -ear pain, -sore throat Cardiology:  -chest pain, -palpitations, -edema Respiratory: -cough, -shortness of breath, -wheezing Gastroenterology: -abdominal pain, -nausea, -vomiting, -diarrhea, -constipation  Hematology: -bleeding or bruising problems Musculoskeletal: -arthralgias, -myalgias, -joint swelling, -back pain Ophthalmology: -vision changes Urology: -dysuria, -difficulty urinating, -hematuria, -urinary frequency, -urgency Neurology: -headache, -weakness, -tingling, -numbness       Objective:   Physical Exam BP 104/72 (BP Location: Right Arm, Patient Position: Sitting, Cuff Size: Normal)   Pulse 89   Ht 5' 2.25" (1.581 m)   Wt 141 lb 6.4 oz (64.1 kg)   SpO2 98%   BMI 25.66 kg/m   General Appearance:    Alert, cooperative, no distress, appears stated age  Head:    Normocephalic, without obvious abnormality, atraumatic  Eyes:    PERRL, conjunctiva/corneas clear, EOM's  intact, fundi    benign  Ears:    Normal TM's and external ear canals  Nose:   Nares normal, mucosa normal, no drainage or sinus   tenderness  Throat:   Lips, mucosa, and tongue normal; teeth and gums normal  Neck:   Supple, no lymphadenopathy;  thyroid:  no   enlargement/tenderness/nodules; no carotid   bruit or JVD  Back:    Spine nontender, no curvature, ROM normal, no CVA     tenderness  Lungs:     Clear to auscultation bilaterally without wheezes, rales or     ronchi; respirations unlabored  Chest Wall:    No tenderness or deformity   Heart:    Regular rate and rhythm, S1 and S2 normal, no murmur, rub   or gallop  Breast Exam:    Mammogram ordered.   Abdomen:     Soft, non-tender, nondistended, normoactive bowel sounds,    no masses, no hepatosplenomegaly  Genitalia:    Normal external genitalia without lesions.  BUS and vagina normal; cervix without lesions, or cervical motion tenderness. No abnormal vaginal discharge.  Uterus and adnexa not enlarged, nontender, no masses.  Pap performed. Chaperone present.      Extremities:   No clubbing, cyanosis or edema  Pulses:   2+ and symmetric all extremities  Skin:   Skin color, texture, turgor normal, no rashes or lesions  Lymph nodes:   Cervical, supraclavicular, and axillary nodes normal  Neurologic:   CNII-XII intact, normal strength, sensation and gait; reflexes 2+ and symmetric throughout          Psych:   Normal mood,  affect, hygiene and grooming.    Urinalysis dipstick:  1+ blood (sent for microscopy) asymptomatic        Assessment & Plan:  Routine general medical examination at a health care facility - Plan: POCT Urinalysis DIP (Proadvantage Device)  H/O cervical polypectomy  Estrogen deficiency - Plan: DG Bone Density  Screening for breast cancer - Plan: MM DIGITAL SCREENING BILATERAL  Screen for colon cancer - Plan: POCT occult blood stool  Screening for cervical cancer - Plan: Cytology - PAP  Encounter for  hepatitis C virus screening test for high risk patient - Plan: Hepatitis C antibody  Screening for HIV without presence of risk factors - Plan: HIV antibody  Hematuria, unspecified type - Plan: Urine Microscopic  Vaccine counseling  She appears to be doing well.  She had labs done last week and brought in her results.  Normal CBC, CMP, thyroid panel. LDL 130. Counseling done on healthy diet and exercise to lower cholesterol.  Mammogram and bone density ordered. This will be her first DEXA.  She has never had a colonoscopy. Will check stool cards and she will let me know when she is ready to see GI. She will need a new referral.  Hep C and HIV screening done, deficient in these.  Counseling done on Shingrix. She will check with insurance and let us know.  Pap smear done and chaperone present.  UA dipstick 1+ blood. Will send for urine microscopy and follow up.  Follow up pending labs.

## 2018-03-20 ENCOUNTER — Other Ambulatory Visit (HOSPITAL_COMMUNITY)
Admission: RE | Admit: 2018-03-20 | Discharge: 2018-03-20 | Disposition: A | Discharge status: Home / Self Care | Payer: BC Managed Care – PPO | Source: Ambulatory Visit | Attending: Family Medicine | Admitting: Family Medicine

## 2018-03-20 ENCOUNTER — Encounter: Payer: Self-pay | Admitting: Family Medicine

## 2018-03-20 ENCOUNTER — Ambulatory Visit: Payer: BC Managed Care – PPO | Admitting: Family Medicine

## 2018-03-20 VITALS — BP 104/72 | HR 89 | Ht 62.25 in | Wt 141.4 lb

## 2018-03-20 DIAGNOSIS — Z114 Encounter for screening for human immunodeficiency virus [HIV]: Secondary | ICD-10-CM

## 2018-03-20 DIAGNOSIS — Z7185 Encounter for immunization safety counseling: Secondary | ICD-10-CM

## 2018-03-20 DIAGNOSIS — Z9189 Other specified personal risk factors, not elsewhere classified: Secondary | ICD-10-CM

## 2018-03-20 DIAGNOSIS — Z124 Encounter for screening for malignant neoplasm of cervix: Secondary | ICD-10-CM | POA: Diagnosis not present

## 2018-03-20 DIAGNOSIS — Z1231 Encounter for screening mammogram for malignant neoplasm of breast: Secondary | ICD-10-CM | POA: Diagnosis not present

## 2018-03-20 DIAGNOSIS — Z7189 Other specified counseling: Secondary | ICD-10-CM

## 2018-03-20 DIAGNOSIS — Z1159 Encounter for screening for other viral diseases: Secondary | ICD-10-CM

## 2018-03-20 DIAGNOSIS — R319 Hematuria, unspecified: Secondary | ICD-10-CM | POA: Diagnosis not present

## 2018-03-20 DIAGNOSIS — Z9889 Other specified postprocedural states: Secondary | ICD-10-CM | POA: Diagnosis not present

## 2018-03-20 DIAGNOSIS — Z1239 Encounter for other screening for malignant neoplasm of breast: Secondary | ICD-10-CM

## 2018-03-20 DIAGNOSIS — Z1211 Encounter for screening for malignant neoplasm of colon: Secondary | ICD-10-CM | POA: Diagnosis not present

## 2018-03-20 DIAGNOSIS — Z0000 Encounter for general adult medical examination without abnormal findings: Secondary | ICD-10-CM

## 2018-03-20 DIAGNOSIS — E2839 Other primary ovarian failure: Secondary | ICD-10-CM

## 2018-03-20 DIAGNOSIS — Z8742 Personal history of other diseases of the female genital tract: Secondary | ICD-10-CM | POA: Diagnosis not present

## 2018-03-20 LAB — POCT URINALYSIS DIP (PROADVANTAGE DEVICE)
Bilirubin, UA: NEGATIVE
GLUCOSE UA: NEGATIVE mg/dL
Ketones, POC UA: NEGATIVE mg/dL
Leukocytes, UA: NEGATIVE
Nitrite, UA: NEGATIVE
Protein Ur, POC: NEGATIVE mg/dL
SPECIFIC GRAVITY, URINE: 1.03
Urobilinogen, Ur: NEGATIVE
pH, UA: 6 (ref 5.0–8.0)

## 2018-03-20 NOTE — Patient Instructions (Addendum)
Call and schedule your mammogram and bone density.   Let me know when you are ready to see GI to discuss your screening colonoscopy.  Return the stool cards when they are done.   Cut back on fried foods, fatty foods, creamy sauces and creamy dressings. Increase your physical activity.   Call your insurance company and find out about the Shingrix vaccine. Let us know if you decide to get this.   We will call you with your lab results.   Preventative Care for Adults - Female      MAINTAIN REGULAR HEALTH EXAMS:  A routine yearly physical is a good way to check in with your primary care provider about your health and preventive screening. It is also an opportunity to share updates about your health and any concerns you have, and receive a thorough all-over exam.   Most health insurance companies pay for at least some preventative services.  Check with your health plan for specific coverages.  WHAT PREVENTATIVE SERVICES DO WOMEN NEED?  Adult women should have their weight and blood pressure checked regularly.   Women age 57 and older should have their cholesterol levels checked regularly.  Women should be screened for cervical cancer with a Pap smear and pelvic exam beginning at age 57.  Breast cancer screening generally begins at age 57 with a mammogram and breast exam by your primary care provider.    Beginning at age 57 and continuing to age 10175, women should be screened for colorectal cancer.  Certain people may need continued testing until age 57.  Updating vaccinations is part of preventative care.  Vaccinations help protect against diseases such as the flu.  Osteoporosis is a disease in which the bones lose minerals and strength as we age. Women ages 5565 and over should discuss this with their caregivers, as should women after menopause who have other risk factors.  Lab tests are generally done as part of preventative care to screen for anemia and blood disorders, to screen for  problems with the kidneys and liver, to screen for bladder problems, to check blood sugar, and to check your cholesterol level.  Preventative services generally include counseling about diet, exercise, avoiding tobacco, drugs, excessive alcohol consumption, and sexually transmitted infections.    GENERAL RECOMMENDATIONS FOR GOOD HEALTH:  Healthy diet:  Eat a variety of foods, including fruit, vegetables, animal or vegetable protein, such as meat, fish, chicken, and eggs, or beans, lentils, tofu, and grains, such as rice.  Drink plenty of water daily.  Decrease saturated fat in the diet, avoid lots of red meat, processed foods, sweets, fast foods, and fried foods.  Exercise:  Aerobic exercise helps maintain good heart health. At least 30-40 minutes of moderate-intensity exercise is recommended. For example, a brisk walk that increases your heart rate and breathing. This should be done on most days of the week.   Find a type of exercise or a variety of exercises that you enjoy so that it becomes a part of your daily life.  Examples are running, walking, swimming, water aerobics, and biking.  For motivation and support, explore group exercise such as aerobic class, spin class, Zumba, Yoga,or  martial arts, etc.    Set exercise goals for yourself, such as a certain weight goal, walk or run in a race such as a 5k walk/run.  Speak to your primary care provider about exercise goals.  Disease prevention:  If you smoke or chew tobacco, find out from your caregiver how to  quit. It can literally save your life, no matter how long you have been a tobacco user. If you do not use tobacco, never begin.   Maintain a healthy diet and normal weight. Increased weight leads to problems with blood pressure and diabetes.   The Body Mass Index or BMI is a way of measuring how much of your body is fat. Having a BMI above 27 increases the risk of heart disease, diabetes, hypertension, stroke and other problems  related to obesity. Your caregiver can help determine your BMI and based on it develop an exercise and dietary program to help you achieve or maintain this important measurement at a healthful level.  High blood pressure causes heart and blood vessel problems.  Persistent high blood pressure should be treated with medicine if weight loss and exercise do not work.   Fat and cholesterol leaves deposits in your arteries that can block them. This causes heart disease and vessel disease elsewhere in your body.  If your cholesterol is found to be high, or if you have heart disease or certain other medical conditions, then you may need to have your cholesterol monitored frequently and be treated with medication.   Ask if you should have a cardiac stress test if your history suggests this. A stress test is a test done on a treadmill that looks for heart disease. This test can find disease prior to there being a problem.  Menopause can be associated with physical symptoms and risks. Hormone replacement therapy is available to decrease these. You should talk to your caregiver about whether starting or continuing to take hormones is right for you.   Osteoporosis is a disease in which the bones lose minerals and strength as we age. This can result in serious bone fractures. Risk of osteoporosis can be identified using a bone density scan. Women ages 8 and over should discuss this with their caregivers, as should women after menopause who have other risk factors. Ask your caregiver whether you should be taking a calcium supplement and Vitamin D, to reduce the rate of osteoporosis.   Avoid drinking alcohol in excess (more than two drinks per day).  Avoid use of street drugs. Do not share needles with anyone. Ask for professional help if you need assistance or instructions on stopping the use of alcohol, cigarettes, and/or drugs.  Brush your teeth twice a day with fluoride toothpaste, and floss once a day. Good oral  hygiene prevents tooth decay and gum disease. The problems can be painful, unattractive, and can cause other health problems. Visit your dentist for a routine oral and dental check up and preventive care every 6-12 months.   Look at your skin regularly.  Use a mirror to look at your back. Notify your caregivers of changes in moles, especially if there are changes in shapes, colors, a size larger than a pencil eraser, an irregular border, or development of new moles.  Safety:  Use seatbelts 100% of the time, whether driving or as a passenger.  Use safety devices such as hearing protection if you work in environments with loud noise or significant background noise.  Use safety glasses when doing any work that could send debris in to the eyes.  Use a helmet if you ride a bike or motorcycle.  Use appropriate safety gear for contact sports.  Talk to your caregiver about gun safety.  Use sunscreen with a SPF (or skin protection factor) of 15 or greater.  Lighter skinned people are at a  greater risk of skin cancer. Don't forget to also wear sunglasses in order to protect your eyes from too much damaging sunlight. Damaging sunlight can accelerate cataract formation.   Practice safe sex. Use condoms. Condoms are used for birth control and to help reduce the spread of sexually transmitted infections (or STIs).  Some of the STIs are gonorrhea (the clap), chlamydia, syphilis, trichomonas, herpes, HPV (human papilloma virus) and HIV (human immunodeficiency virus) which causes AIDS. The herpes, HIV and HPV are viral illnesses that have no cure. These can result in disability, cancer and death.   Keep carbon monoxide and smoke detectors in your home functioning at all times. Change the batteries every 6 months or use a model that plugs into the wall.   Vaccinations:  Stay up to date with your tetanus shots and other required immunizations. You should have a booster for tetanus every 10 years. Be sure to get your  flu shot every year, since 5%-20% of the [place] population comes down with the flu. The flu vaccine changes each year, so being vaccinated once is not enough. Get your shot in the fall, before the flu season peaks.   Other vaccines to consider:  Human Papilloma Virus or HPV causes cancer of the cervix, and other infections that can be transmitted from person to person. There is a vaccine for HPV, and females should get immunized between the ages of 85 and 42. It requires a series of 3 shots.   Pneumococcal vaccine to protect against certain types of pneumonia.  This is normally recommended for adults age 42 or older.  However, adults younger than 57 years old with certain underlying conditions such as diabetes, heart or lung disease should also receive the vaccine.  Shingles vaccine to protect against Varicella Zoster if you are older than age 85, or younger than 57 years old with certain underlying illness.  Hepatitis A vaccine to protect against a form of infection of the liver by a virus acquired from food.  Hepatitis B vaccine to protect against a form of infection of the liver by a virus acquired from blood or body fluids, particularly if you work in health care.  If you plan to travel internationally, check with your local health department for specific vaccination recommendations.  Cancer Screening:  Breast cancer screening is essential to preventive care for women. All women age 65 and older should perform a breast self-exam every month. At age 24 and older, women should have their caregiver complete a breast exam each year. Women at ages 66 and older should have a mammogram (x-ray film) of the breasts. Your caregiver can discuss how often you need mammograms.    Cervical cancer screening includes taking a Pap smear (sample of cells examined under a microscope) from the cervix (end of the uterus). It also includes testing for HPV (Human Papilloma Virus, which can cause cervical cancer).  Screening and a pelvic exam should begin at age 30, or 3 years after a woman becomes sexually active. Screening should occur every year, with a Pap smear but no HPV testing, up to age 64. After age 37, you should have a Pap smear every 3 years with HPV testing, if no HPV was found previously.   Most routine colon cancer screening begins at the age of 11. On a yearly basis, doctors may provide special easy to use take-home tests to check for hidden blood in the stool. Sigmoidoscopy or colonoscopy can detect the earliest forms of colon cancer and  is life saving. These tests use a small camera at the end of a tube to directly examine the colon. Speak to your caregiver about this at age 52, when routine screening begins (and is repeated every 5 years unless early forms of pre-cancerous polyps or small growths are found).

## 2018-03-21 ENCOUNTER — Other Ambulatory Visit: Payer: Self-pay | Admitting: Family Medicine

## 2018-03-21 ENCOUNTER — Encounter: Payer: Self-pay | Admitting: Family Medicine

## 2018-03-21 DIAGNOSIS — R3121 Asymptomatic microscopic hematuria: Secondary | ICD-10-CM

## 2018-03-21 HISTORY — DX: Asymptomatic microscopic hematuria: R31.21

## 2018-03-21 LAB — URINALYSIS, MICROSCOPIC ONLY

## 2018-03-21 LAB — HIV ANTIBODY (ROUTINE TESTING W REFLEX): HIV Screen 4th Generation wRfx: NONREACTIVE

## 2018-03-21 LAB — HEPATITIS C ANTIBODY: Hep C Virus Ab: <0.1 s/co ratio (ref 0.0–0.9)

## 2018-03-21 MED ORDER — SULFAMETHOXAZOLE-TRIMETHOPRIM 800-160 MG PO TABS: 1.0000 | ORAL_TABLET | Freq: Two times a day (BID) | ORAL | 0 refills | Status: DC

## 2018-03-25 LAB — CYTOLOGY - PAP
Diagnosis: NEGATIVE
HPV: NOT DETECTED

## 2018-03-26 ENCOUNTER — Other Ambulatory Visit: Payer: Self-pay | Admitting: Family Medicine

## 2018-05-19 ENCOUNTER — Ambulatory Visit
Admission: RE | Admit: 2018-05-19 | Discharge: 2018-05-19 | Disposition: A | Discharge status: Home / Self Care | Payer: BC Managed Care – PPO | Source: Ambulatory Visit | Attending: Family Medicine | Admitting: Family Medicine

## 2018-05-19 DIAGNOSIS — E2839 Other primary ovarian failure: Secondary | ICD-10-CM

## 2018-05-19 DIAGNOSIS — Z1239 Encounter for other screening for malignant neoplasm of breast: Secondary | ICD-10-CM

## 2018-06-30 ENCOUNTER — Other Ambulatory Visit: Payer: Self-pay | Admitting: Family Medicine

## 2018-06-30 NOTE — Telephone Encounter (Signed)
[facility] is requesting to fill pt estradiol. Please advise K[facility] 

## 2018-09-05 ENCOUNTER — Other Ambulatory Visit: Payer: Self-pay | Admitting: Family Medicine

## 2018-09-05 NOTE — Telephone Encounter (Signed)
Is this okay to refill? 

## 2018-10-20 ENCOUNTER — Other Ambulatory Visit: Payer: Self-pay | Admitting: Family Medicine

## 2018-10-20 ENCOUNTER — Telehealth: Payer: Self-pay | Admitting: Family Medicine

## 2018-10-20 NOTE — Telephone Encounter (Signed)
Patient called and wanted refill status.  It appears, just received and sent to [facility].  Pt made aware.

## 2018-10-20 NOTE — Telephone Encounter (Signed)
Is this okay to refill? 

## 2018-11-20 ENCOUNTER — Telehealth: Payer: Self-pay | Admitting: Internal Medicine

## 2018-11-20 MED ORDER — ESTRADIOL 10 MCG VA TABS: ORAL_TABLET | VAGINAL | 0 refills | Status: DC

## 2018-11-20 NOTE — Telephone Encounter (Signed)
Ok

## 2018-11-20 NOTE — Addendum Note (Signed)
Addended by: Herminio CommonsJOHNSON, Jordis Repetto A on: 11/20/2018 10:31 AM   Modules accepted: Orders

## 2018-11-20 NOTE — Telephone Encounter (Signed)
Done

## 2018-11-20 NOTE — Telephone Encounter (Signed)
Refill request for estradiol 10mg  vaginal tab. Is this okay to refill?

## 2018-12-26 ENCOUNTER — Other Ambulatory Visit: Payer: Self-pay | Admitting: Family Medicine

## 2018-12-26 NOTE — Telephone Encounter (Signed)
cpe due in april

## 2019-01-27 ENCOUNTER — Telehealth: Payer: Self-pay | Admitting: Family Medicine

## 2019-01-27 NOTE — Telephone Encounter (Signed)
Please check and see if she is due for refill of estradiol vaginal tablets. She is using these twice weekly. I thought I refilled early January and gave her 2 refills.

## 2019-01-27 NOTE — Telephone Encounter (Signed)
Pt request refill of Estradiol 10 mcg to [facility] at [address].

## 2019-01-28 MED ORDER — ESTRADIOL 10 MCG VA TABS: ORAL_TABLET | VAGINAL | 1 refills | Status: DC

## 2019-01-28 NOTE — Telephone Encounter (Signed)
I have refilled med to different pharmacy # 8 with 1 refill

## 2019-01-29 NOTE — Telephone Encounter (Signed)
dt ?

## 2019-04-15 ENCOUNTER — Other Ambulatory Visit: Payer: Self-pay | Admitting: Family Medicine

## 2019-04-19 NOTE — Progress Notes (Signed)
Subjective:    Patient ID: Karina Walsh, female    DOB: June 27, 1961, 58 y.o.   MRN: [id number]  HPI Chief Complaint  Patient presents with  . cpe    cpe   She is here for a complete physical exam and follow up on chronic health conditions.   Hematuria worked up by urologist last year.   Estrogen deficiency and vaginal dryness- using estradiol vaginal tabs twice weekly for vaginal dryness. Reports this helps tremendously. No dyspareunia.    Other providers: Urologist   Social history: Lives with husband and has 2 adopted kids, works as a Engineer, sitemedical assistant with [employer]  Denies smoking, drinking alcohol, drug use  Diet: fairly healthy  Excerise: fairly active. Walks and plays tennis   Immunizations: Tdap 2012   Health maintenance:  Mammogram: 03/2018 Colonoscopy: never. Declines to have this done due to not wanting to do the prep.  Last Gynecological Exam: 2019 and pap normal with negative HPV.  DEXA: 05/2018 osteopenia  Last Dental Exam: Dr. Gwendolyn GrantWalrond in [place]. Once annually  Last Eye Exam: uses reading glasses   Wears seatbelt always, uses sunscreen, smoke detectors in home and functioning, does not text while driving and feels safe in home environment.   Reviewed allergies, medications, past medical, surgical, family, and social history.   Review of Systems Review of Systems Constitutional: -fever, -chills, -sweats, -unexpected weight change,-fatigue ENT: -runny nose, -ear pain, -sore throat Cardiology:  -chest pain, -palpitations, -edema Respiratory: -cough, -shortness of breath, -wheezing Gastroenterology: -abdominal pain, -nausea, -vomiting, -diarrhea, -constipation  Hematology: -bleeding or bruising problems Musculoskeletal: -arthralgias, -myalgias, -joint swelling, -back pain Ophthalmology: -vision changes Urology: -dysuria, -difficulty urinating, -hematuria, -urinary frequency, -urgency Neurology: -headache, -weakness,  -tingling, -numbness       Objective:   Physical Exam BP 110/60   Pulse 74   Temp 98.6 F (37 C) (Temporal)   Ht 5\' 3"  (1.6 m)   Wt 136 lb 3.2 oz (61.8 kg)   BMI 24.13 kg/m   General Appearance:    Alert, cooperative, no distress, appears stated age  Head:    Normocephalic, without obvious abnormality, atraumatic  Eyes:    PERRL, conjunctiva/corneas clear, EOM's intact, fundi    benign  Ears:    Normal TM's and external ear canals  Nose:   Nares normal, mucosa normal, no drainage or sinus   tenderness  Throat:   Lips, mucosa, and tongue normal; teeth and gums normal  Neck:   Supple, no lymphadenopathy;  thyroid:  no   enlargement/tenderness/nodules; no carotid   bruit or JVD  Back:    Spine nontender, no curvature, ROM normal, no CVA     tenderness  Lungs:     Clear to auscultation bilaterally without wheezes, rales or     ronchi; respirations unlabored  Chest Wall:    No tenderness or deformity   Heart:    Regular rate and rhythm, S1 and S2 normal, no murmur, rub   or gallop  Breast Exam:    Mammogram ordered   Abdomen:     Soft, non-tender, nondistended, normoactive bowel sounds,    no masses, no hepatosplenomegaly  Genitalia:    Normal external genitalia without lesions.  BUS and vagina normal; cervix without lesions, or cervical motion tenderness. No abnormal vaginal discharge.  Uterus and adnexa not enlarged, nontender, no masses.  Pap not performed, not due.      Extremities:   No clubbing, cyanosis or edema  Pulses:  2+ and symmetric all extremities  Skin:   Skin color, texture, turgor normal, no rashes or lesions  Lymph nodes:   Cervical, supraclavicular, and axillary nodes normal  Neurologic:   CNII-XII intact, normal strength, sensation and gait; reflexes 2+ and symmetric throughout          Psych:   Normal mood, affect, hygiene and grooming.         Assessment & Plan:  Routine general medical examination at a health care facility - Plan: POCT Urinalysis DIP  (Proadvantage Device), CBC with Differential/Platelet, Comprehensive metabolic panel, Lipid panel, TSH  Menopause  Estrogen deficiency  Osteopenia, unspecified location - Plan: VITAMIN D 25 Hydroxy (Vit-D Deficiency, Fractures)  Immunization counseling  Screening for malignant neoplasm of colon - Plan: Cologuard  Colonoscopy refused - Plan: Cologuard  Bilirubin in urine  Elevated LDL cholesterol level  History of vitamin D deficiency - Plan: VITAMIN D 25 Hydroxy (Vit-D Deficiency, Fractures)  Vaginal dryness, menopausal - Plan: Estradiol 10 MCG TABS vaginal tablet  She is here today for a fasting CPE.  Doing well and in her usual state of health.  Reviewed and updated PMH, PSH, family history.  Osteopenia- continue getting adequate calcium in diet, taking vitamin D supplement and doing weight bearing exercises. Recheck vitamin D.  Refused to do colonoscopy and we will order a cologuard. She appears to be of average risk.  Microscopic hematuria history- this has been worked up by urology and benign.  Bilirubin in urine today- check liver function tests.  LDL- discussed low fat, low cholesterol diet and recheck fasting lipids. Not on statin.  Follow up pending labs.

## 2019-04-20 ENCOUNTER — Ambulatory Visit (INDEPENDENT_AMBULATORY_CARE_PROVIDER_SITE_OTHER): Payer: BC Managed Care – PPO | Admitting: Family Medicine

## 2019-04-20 ENCOUNTER — Other Ambulatory Visit: Payer: Self-pay

## 2019-04-20 ENCOUNTER — Encounter: Payer: Self-pay | Admitting: Family Medicine

## 2019-04-20 VITALS — BP 110/60 | HR 74 | Temp 98.6°F | Ht 63.0 in | Wt 136.2 lb

## 2019-04-20 DIAGNOSIS — E2839 Other primary ovarian failure: Secondary | ICD-10-CM

## 2019-04-20 DIAGNOSIS — E7800 Pure hypercholesterolemia, unspecified: Secondary | ICD-10-CM

## 2019-04-20 DIAGNOSIS — Z0000 Encounter for general adult medical examination without abnormal findings: Secondary | ICD-10-CM | POA: Diagnosis not present

## 2019-04-20 DIAGNOSIS — M8580 Other specified disorders of bone density and structure, unspecified site: Secondary | ICD-10-CM | POA: Diagnosis not present

## 2019-04-20 DIAGNOSIS — R822 Biliuria: Secondary | ICD-10-CM | POA: Insufficient documentation

## 2019-04-20 DIAGNOSIS — Z5320 Procedure and treatment not carried out because of patient's decision for unspecified reasons: Secondary | ICD-10-CM

## 2019-04-20 DIAGNOSIS — Z1211 Encounter for screening for malignant neoplasm of colon: Secondary | ICD-10-CM

## 2019-04-20 DIAGNOSIS — Z780 Asymptomatic menopausal state: Secondary | ICD-10-CM

## 2019-04-20 DIAGNOSIS — Z7185 Encounter for immunization safety counseling: Secondary | ICD-10-CM

## 2019-04-20 DIAGNOSIS — Z7189 Other specified counseling: Secondary | ICD-10-CM

## 2019-04-20 DIAGNOSIS — Z8639 Personal history of other endocrine, nutritional and metabolic disease: Secondary | ICD-10-CM

## 2019-04-20 DIAGNOSIS — N951 Menopausal and female climacteric states: Secondary | ICD-10-CM

## 2019-04-20 LAB — POCT URINALYSIS DIP (PROADVANTAGE DEVICE)
Glucose, UA: NEGATIVE mg/dL
Leukocytes, UA: NEGATIVE
Nitrite, UA: NEGATIVE
Protein Ur, POC: NEGATIVE mg/dL
Specific Gravity, Urine: 1.03
Urobilinogen, Ur: NEGATIVE
pH, UA: 6 (ref 5.0–8.0)

## 2019-04-20 NOTE — Patient Instructions (Signed)
It was a pleasure seeing you today.   You should start taking a Women's One A Day multivitamin or you can just take over the counter Vitamin D 800 or 1,000 IU daily if you prefer.  Make sure you are getting at least 1,200 mg of calcium in your diet daily as well.  Continue getting at least 150 minutes of physical activity per week.   You will receive the Cologuard testing kit in the mail with instructions.   If you decide you would like to get the Shingrix vaccine, you can call and let us know. Check on your Co-pay first.   We will call you with your lab results.     Preventive Care 40-64 Years, Female Preventive care refers to lifestyle choices and visits with your health care provider that can promote health and wellness. What does preventive care include?   A yearly physical exam. This is also called an annual well check.  Dental exams once or twice a year.  Routine eye exams. Ask your health care provider how often you should have your eyes checked.  Personal lifestyle choices, including: ? Daily care of your teeth and gums. ? Regular physical activity. ? Eating a healthy diet. ? Avoiding tobacco and drug use. ? Limiting alcohol use. ? Practicing safe sex. ? Taking low-dose aspirin daily starting at age 22. ? Taking vitamin and mineral supplements as recommended by your health care provider. What happens during an annual well check? The services and screenings done by your health care provider during your annual well check will depend on your age, overall health, lifestyle risk factors, and family history of disease. Counseling Your health care provider may ask you questions about your:  Alcohol use.  Tobacco use.  Drug use.  Emotional well-being.  Home and relationship well-being.  Sexual activity.  Eating habits.  Work and work Statistician.  Method of birth control.  Menstrual cycle.  Pregnancy history. Screening You may have the following tests or  measurements:  Height, weight, and BMI.  Blood pressure.  Lipid and cholesterol levels. These may be checked every 5 years, or more frequently if you are over 25 years old.  Skin check.  Lung cancer screening. You may have this screening every year starting at age 49 if you have a 30-pack-year history of smoking and currently smoke or have quit within the past 15 years.  Colorectal cancer screening. All adults should have this screening starting at age 16 and continuing until age 31. Your health care provider may recommend screening at age 55. You will have tests every 1-10 years, depending on your results and the type of screening test. People at increased risk should start screening at an earlier age. Screening tests may include: ? Guaiac-based fecal occult blood testing. ? Fecal immunochemical test (FIT). ? Stool DNA test. ? Virtual colonoscopy. ? Sigmoidoscopy. During this test, a flexible tube with a tiny camera (sigmoidoscope) is used to examine your rectum and lower colon. The sigmoidoscope is inserted through your anus into your rectum and lower colon. ? Colonoscopy. During this test, a long, thin, flexible tube with a tiny camera (colonoscope) is used to examine your entire colon and rectum.  Hepatitis C blood test.  Hepatitis B blood test.  Sexually transmitted disease (STD) testing.  Diabetes screening. This is done by checking your blood sugar (glucose) after you have not eaten for a while (fasting). You may have this done every 1-3 years.  Mammogram. This may be done every  1-2 years. Talk to your health care provider about when you should start having regular mammograms. This may depend on whether you have a family history of breast cancer.  BRCA-related cancer screening. This may be done if you have a family history of breast, ovarian, tubal, or peritoneal cancers.  Pelvic exam and Pap test. This may be done every 3 years starting at age 39. Starting at age 56, this may  be done every 5 years if you have a Pap test in combination with an HPV test.  Bone density scan. This is done to screen for osteoporosis. You may have this scan if you are at high risk for osteoporosis. Discuss your test results, treatment options, and if necessary, the need for more tests with your health care provider. Vaccines Your health care provider may recommend certain vaccines, such as:  Influenza vaccine. This is recommended every year.  Tetanus, diphtheria, and acellular pertussis (Tdap, Td) vaccine. You may need a Td booster every 10 years.  Varicella vaccine. You may need this if you have not been vaccinated.  Zoster vaccine. You may need this after age 28.  Measles, mumps, and rubella (MMR) vaccine. You may need at least one dose of MMR if you were born in 1957 or later. You may also need a second dose.  Pneumococcal 13-valent conjugate (PCV13) vaccine. You may need this if you have certain conditions and were not previously vaccinated.  Pneumococcal polysaccharide (PPSV23) vaccine. You may need one or two doses if you smoke cigarettes or if you have certain conditions.  Meningococcal vaccine. You may need this if you have certain conditions.  Hepatitis A vaccine. You may need this if you have certain conditions or if you travel or work in places where you may be exposed to hepatitis A.  Hepatitis B vaccine. You may need this if you have certain conditions or if you travel or work in places where you may be exposed to hepatitis B.  Haemophilus influenzae type b (Hib) vaccine. You may need this if you have certain conditions. Talk to your health care provider about which screenings and vaccines you need and how often you need them. This information is not intended to replace advice given to you by your health care provider. Make sure you discuss any questions you have with your health care provider. Document Released: 12/30/2015 Document Revised: 01/23/2018 Document  Reviewed: 10/04/2015 Elsevier Interactive Patient Education  2019 [employer].

## 2019-04-21 LAB — COMPREHENSIVE METABOLIC PANEL
ALT: 14 IU/L (ref 0–32)
AST: 19 IU/L (ref 0–40)
Albumin/Globulin Ratio: 1.9 (ref 1.2–2.2)
Albumin: 4.4 g/dL (ref 3.8–4.9)
Alkaline Phosphatase: 92 IU/L (ref 39–117)
BUN/Creatinine Ratio: 23 (ref 9–23)
BUN: 21 mg/dL (ref 6–24)
Bilirubin Total: 0.6 mg/dL (ref 0.0–1.2)
CO2: 23 mmol/L (ref 20–29)
Calcium: 9.3 mg/dL (ref 8.7–10.2)
Chloride: 105 mmol/L (ref 96–106)
Creatinine, Ser: 0.93 mg/dL (ref 0.57–1.00)
GFR calc Af Amer: 79 mL/min/{1.73_m2} (ref >59)
GFR calc non Af Amer: 68 mL/min/{1.73_m2} (ref >59)
Globulin, Total: 2.3 g/dL (ref 1.5–4.5)
Glucose: 86 mg/dL (ref 65–99)
Potassium: 4.5 mmol/L (ref 3.5–5.2)
Sodium: 142 mmol/L (ref 134–144)
Total Protein: 6.7 g/dL (ref 6.0–8.5)

## 2019-04-21 LAB — LIPID PANEL
Chol/HDL Ratio: 4.3 ratio (ref 0.0–4.4)
Cholesterol, Total: 213 mg/dL — ABNORMAL HIGH (ref 100–199)
HDL: 50 mg/dL (ref >39)
LDL Calculated: 133 mg/dL — ABNORMAL HIGH (ref 0–99)
Triglycerides: 149 mg/dL (ref 0–149)
VLDL Cholesterol Cal: 30 mg/dL (ref 5–40)

## 2019-04-21 LAB — CBC WITH DIFFERENTIAL/PLATELET
Basophils Absolute: 0.1 10*3/uL (ref 0.0–0.2)
Basos: 1 %
EOS (ABSOLUTE): 0.1 10*3/uL (ref 0.0–0.4)
Eos: 1 %
Hematocrit: 41.3 % (ref 34.0–46.6)
Hemoglobin: 13.6 g/dL (ref 11.1–15.9)
Immature Grans (Abs): 0 10*3/uL (ref 0.0–0.1)
Immature Granulocytes: 0 %
Lymphocytes Absolute: 1.7 10*3/uL (ref 0.7–3.1)
Lymphs: 22 %
MCH: 29.6 pg (ref 26.6–33.0)
MCHC: 32.9 g/dL (ref 31.5–35.7)
MCV: 90 fL (ref 79–97)
Monocytes Absolute: 0.6 10*3/uL (ref 0.1–0.9)
Monocytes: 7 %
Neutrophils Absolute: 5.5 10*3/uL (ref 1.4–7.0)
Neutrophils: 69 %
Platelets: 230 10*3/uL (ref 150–450)
RBC: 4.6 x10E6/uL (ref 3.77–5.28)
RDW: 12.9 % (ref 11.7–15.4)
WBC: 8 10*3/uL (ref 3.4–10.8)

## 2019-04-21 LAB — VITAMIN D 25 HYDROXY (VIT D DEFICIENCY, FRACTURES): Vit D, 25-Hydroxy: 28.5 ng/mL — ABNORMAL LOW (ref 30.0–100.0)

## 2019-04-21 LAB — TSH: TSH: 1.1 u[IU]/mL (ref 0.450–4.500)

## 2019-04-21 MED ORDER — ESTRADIOL 10 MCG VA TABS: ORAL_TABLET | VAGINAL | 3 refills | Status: DC

## 2019-06-04 ENCOUNTER — Encounter: Payer: Self-pay | Admitting: Family Medicine

## 2019-11-04 ENCOUNTER — Other Ambulatory Visit: Payer: Self-pay | Admitting: Family Medicine

## 2019-11-04 DIAGNOSIS — N951 Menopausal and female climacteric states: Secondary | ICD-10-CM

## 2019-11-04 NOTE — Telephone Encounter (Signed)
Is this okay to refill? 

## 2020-01-11 ENCOUNTER — Ambulatory Visit: Payer: BC Managed Care – PPO | Attending: Internal Medicine

## 2020-01-11 DIAGNOSIS — Z23 Encounter for immunization: Secondary | ICD-10-CM | POA: Insufficient documentation

## 2020-01-11 NOTE — Progress Notes (Signed)
   Covid-19 Vaccination Clinic  Name:  [name]    MRN: [id number] DOB: [birth year]  01/11/2020  [name] was observed post Covid-19 immunization for 15 minutes without incidence. She was provided with Vaccine Information Sheet and instruction to access the V-Safe system.   [name] was instructed to call 911 with any severe reactions post vaccine: Marland Kitchen Difficulty breathing  . Swelling of your face and throat  . A fast heartbeat  . A bad rash all over your body  . Dizziness and weakness    Immunizations Administered    Name Date Dose VIS Date Route   Pfizer COVID-19 Vaccine 01/11/2020  5:43 PM 0.3 mL 11/27/2019 Intramuscular   Manufacturer: [employer], [employer]   Lot: [id number]   NDC: [address]-8

## 2020-01-22 ENCOUNTER — Ambulatory Visit: Payer: No Typology Code available for payment source

## 2020-02-01 ENCOUNTER — Ambulatory Visit: Payer: BC Managed Care – PPO | Attending: Internal Medicine

## 2020-02-01 DIAGNOSIS — Z23 Encounter for immunization: Secondary | ICD-10-CM

## 2020-02-01 NOTE — Progress Notes (Signed)
   Covid-19 Vaccination Clinic  Name:  Karina Walsh    MRN: [id number] DOB: 1961/11/07  02/01/2020  Karina Walsh was observed post Covid-19 immunization for 15 minutes without incidence. She was provided with Vaccine Information Sheet and instruction to access the V-Safe system.   Karina Walsh was instructed to call 911 with any severe reactions post vaccine: Marland Kitchen Difficulty breathing  . Swelling of your face and throat  . A fast heartbeat  . A bad rash all over your body  . Dizziness and weakness    Immunizations Administered    Name Date Dose VIS Date Route   Pfizer COVID-19 Vaccine 02/01/2020  1:13 PM 0.3 mL 11/27/2019 Intramuscular   Manufacturer: [employer], [employer]   Lot: [id number]   NDC: [address]-0

## 2020-02-03 ENCOUNTER — Ambulatory Visit: Payer: BC Managed Care – PPO

## 2020-03-19 ENCOUNTER — Other Ambulatory Visit: Payer: Self-pay | Admitting: Family Medicine

## 2020-03-19 DIAGNOSIS — N951 Menopausal and female climacteric states: Secondary | ICD-10-CM

## 2020-05-18 ENCOUNTER — Other Ambulatory Visit: Payer: Self-pay | Admitting: Family Medicine

## 2020-05-18 DIAGNOSIS — N951 Menopausal and female climacteric states: Secondary | ICD-10-CM

## 2020-05-18 NOTE — Telephone Encounter (Signed)
Pt has an appt scheduled for cpe on 7/7. I will refill until appt

## 2020-06-21 NOTE — Progress Notes (Deleted)
   Subjective:    Patient ID: Karina Walsh, female    DOB: January 06, 1961, 59 y.o.   MRN: [id number]  HPI No chief complaint on file.  She is here for a complete physical exam. Previous medical care: Last CPE:  Other providers:  Past medical history: Surgeries:  Family history: Mental Health History:  Social history: Lives with ***, works as ***, *** Smoking, drinking alcohol, drug use  Diet: *** Excerise: ***  Immunizations:  Health maintenance:  Mammogram: Colonoscopy: Last Gynecological Exam: Last Menstrual cycle: Pregnancies:  Last Dental Exam: Last Eye Exam:  Wears seatbelt always, uses sunscreen, smoke detectors in home and functioning, does not text while driving and feels safe in home environment.   Reviewed allergies, medications, past medical, surgical, family, and social history.     Review of Systems Review of Systems Constitutional: -fever, -chills, -sweats, -unexpected weight change,-fatigue ENT: -runny nose, -ear pain, -sore throat Cardiology:  -chest pain, -palpitations, -edema Respiratory: -cough, -shortness of breath, -wheezing Gastroenterology: -abdominal pain, -nausea, -vomiting, -diarrhea, -constipation  Hematology: -bleeding or bruising problems Musculoskeletal: -arthralgias, -myalgias, -joint swelling, -back pain Ophthalmology: -vision changes Urology: -dysuria, -difficulty urinating, -hematuria, -urinary frequency, -urgency Neurology: -headache, -weakness, -tingling, -numbness       Objective:   Physical Exam There were no vitals taken for this visit.  General Appearance:    Alert, cooperative, no distress, appears stated age  Head:    Normocephalic, without obvious abnormality, atraumatic  Eyes:    PERRL, conjunctiva/corneas clear, EOM's intact, fundi    benign  Ears:    Normal TM's and external ear canals  Nose:   Nares normal, mucosa normal, no drainage or sinus   tenderness  Throat:   Lips, mucosa, and tongue normal; teeth  and gums normal  Neck:   Supple, no lymphadenopathy;  thyroid:  no   enlargement/tenderness/nodules; no carotid   bruit or JVD  Back:    Spine nontender, no curvature, ROM normal, no CVA     tenderness  Lungs:     Clear to auscultation bilaterally without wheezes, rales or     ronchi; respirations unlabored  Chest Wall:    No tenderness or deformity   Heart:    Regular rate and rhythm, S1 and S2 normal, no murmur, rub   or gallop  Breast Exam:    No tenderness, masses, or nipple discharge or inversion.      No axillary lymphadenopathy  Abdomen:     Soft, non-tender, nondistended, normoactive bowel sounds,    no masses, no hepatosplenomegaly  Genitalia:    Normal external genitalia without lesions.  BUS and vagina normal; cervix without lesions, or cervical motion tenderness. No abnormal vaginal discharge.  Uterus and adnexa not enlarged, nontender, no masses.  Pap performed  Rectal:    Normal tone, no masses or tenderness; guaiac negative stool  Extremities:   No clubbing, cyanosis or edema  Pulses:   2+ and symmetric all extremities  Skin:   Skin color, texture, turgor normal, no rashes or lesions  Lymph nodes:   Cervical, supraclavicular, and axillary nodes normal  Neurologic:   CNII-XII intact, normal strength, sensation and gait; reflexes 2+ and symmetric throughout          Psych:   Normal mood, affect, hygiene and grooming.        Assessment & Plan:  Routine general medical examination at a health care facility  Osteopenia, unspecified location  Estrogen deficiency  Elevated LDL cholesterol level

## 2020-06-22 ENCOUNTER — Encounter: Payer: BC Managed Care – PPO | Admitting: Family Medicine

## 2020-07-21 ENCOUNTER — Other Ambulatory Visit: Payer: Self-pay | Admitting: Family Medicine

## 2020-07-21 DIAGNOSIS — N951 Menopausal and female climacteric states: Secondary | ICD-10-CM

## 2020-07-21 NOTE — Telephone Encounter (Signed)
Pt has upcoming appointment 

## 2020-07-24 ENCOUNTER — Encounter: Payer: Self-pay | Admitting: Family Medicine

## 2020-07-24 DIAGNOSIS — E559 Vitamin D deficiency, unspecified: Secondary | ICD-10-CM

## 2020-07-24 DIAGNOSIS — E7800 Pure hypercholesterolemia, unspecified: Secondary | ICD-10-CM

## 2020-07-24 HISTORY — DX: Pure hypercholesterolemia, unspecified: E78.00

## 2020-07-24 HISTORY — DX: Vitamin D deficiency, unspecified: E55.9

## 2020-07-24 NOTE — Progress Notes (Signed)
Subjective:    Patient ID: Karina Walsh, female    DOB: March 19, 1961, 59 y.o.   MRN: [id number]  HPI Chief Complaint  Patient presents with  . fasting cpe,    fasting cpe, pap done in 2019   She is here for a complete physical exam and to follow up on chronic health conditions.   Other providers: [facility] - has been worked up for asymptomatic hematuria in the past and states she had a normal work up  Her mother died in 2023-05-16 and she lived with her for more than 20 years. Her mother was in her 62s.   Vitamin D def- is not taking a supplement.   Elevated LDL- has been watching her diet off an on. Calculated 10 yr ASCVD risk 2.1% in 05-16-19  Osteopenia- due for repeat DEXA. Is not currently taking a vitamin D supplement.    Estrogen vaginal tabs twice weekly. Has been using these for years, started before switching care to me in 2016.  Having external vulvar irritation since being in a pool all day. She used Monistat 3 day treatment. No abnormal vaginal discharge.  No concern for STD.  No pain with intercourse.   Social history: Lives with her spouse, works at [employer]. Three 10 hour days per week.  Denies smoking, drinking alcohol, drug use  Diet: fairly healthy. Husband is doing [employer].  Excerise: walks a lot   Immunizations: Covid vaccines complete. Tdap due in 08/2021  Health maintenance:  Mammogram: 05/2018 Colonoscopy: never -Cologuard was done in the past but insufficient sample per patient.  DEXA: 05/2018  Last Gynecological Exam: pap smear 03/2018 and neg  Wears seatbelt always, uses sunscreen, smoke detectors in home and functioning, does not text while driving and feels safe in home environment.   Reviewed allergies, medications, past medical, surgical, family, and social history.   Review of Systems Review of Systems Constitutional: -fever, -chills, -sweats, -unexpected weight change,-fatigue ENT: -runny nose, -ear pain, -sore  throat Cardiology:  -chest pain, -palpitations, -edema Respiratory: -cough, -shortness of breath, -wheezing Gastroenterology: -abdominal pain, -nausea, -vomiting, -diarrhea, -constipation  Hematology: -bleeding or bruising problems Musculoskeletal: -arthralgias, -myalgias, -joint swelling, -back pain Ophthalmology: -vision changes Urology: -dysuria, -difficulty urinating, -hematuria, -urinary frequency, -urgency Neurology: -headache, -weakness, -tingling, -numbness      Objective:   Physical Exam BP 110/70   Pulse 82   Ht 5' 2.5" (1.588 m)   Wt 140 lb (63.5 kg)   BMI 25.20 kg/m   General Appearance:    Alert, cooperative, no distress, appears stated age  Head:    Normocephalic, without obvious abnormality, atraumatic  Eyes:    PERRL, conjunctiva/corneas clear, EOM's intact  Ears:    Normal TM's and external ear canals  Nose:   Mask in place   Throat:   Mask in place   Neck:   Supple, no lymphadenopathy;  thyroid:  no   enlargement/tenderness/nodules; no JVD  Back:    Spine nontender, no curvature, ROM normal, no CVA     tenderness  Lungs:     Clear to auscultation bilaterally without wheezes, rales or     ronchi; respirations unlabored  Chest Wall:    No tenderness or deformity   Heart:    Regular rate and rhythm, S1 and S2 normal, no murmur, rub   or gallop  Breast Exam:    No tenderness, masses, or nipple discharge or inversion.      No axillary lymphadenopathy  Abdomen:  Soft, non-tender, nondistended, normoactive bowel sounds,    no masses, no hepatosplenomegaly  Genitalia:    Erythema to external vulva without lesions.  Mild amount of white thick vaginal discharge.    Pap not performed. Chaperone present.      Extremities:   No clubbing, cyanosis or edema  Pulses:   2+ and symmetric all extremities  Skin:   Skin color, texture, turgor normal, no rashes or lesions  Lymph nodes:   Cervical, supraclavicular, and axillary nodes normal  Neurologic:   CNII-XII intact,  normal strength, sensation and gait; reflexes 2+ and symmetric throughout          Psych:   Normal mood, affect, hygiene and grooming.         Assessment & Plan:  Routine general medical examination at a health care facility - Plan: CBC with Differential/Platelet, Comprehensive metabolic panel, POCT Urinalysis DIP (Proadvantage Device), TSH, T4, free, T3, Lipid panel Reviewed preventive healthcare and immunizations. Counseling on healthy lifestyle. Pap smear UTD. She will call and schedule mammogram, DEXA and dental exam. She needs colon cancer screening and is aware. Cologuard ordered per her preference. Discussed safety.   Elevated LDL cholesterol level - Plan: Lipid panel -ASCVD 2.1% in 04/2019. Follow up pending lipid panel  Vitamin D deficiency - Plan: VITAMIN D 25 Hydroxy (Vit-D Deficiency, Fractures) -consider supplement pending vitamin D level.   Osteopenia, unspecified location - Plan: DG Bone Density -due for recheck. She will call and schedule. Is not on vitamin D supplement. Walks frequently.   Menopause - Plan: DG Bone Density -she has been using estrogen vaginally twice weekly for years. Will consider stopping or referring her to GYN to determine when to stop this. Discussed research regarding whether she needs oral progesterone is always changing.   Encounter for screening for malignant neoplasm of breast, unspecified screening modality - Plan: MM DIGITAL SCREENING BILATERAL -she will call and schedule   Screen for colon cancer - Plan: Cologuard -declines colonoscopy or GI referral. Follow up pending Cologuard.   Screening for heart disease - Plan: EKG 12-Lead -done due to family history. Asymptomatic.   Screening for thyroid disorder - Plan: TSH, T4, free, T3 -follow up pending results.   Vulvovaginitis due to yeast - Plan: fluconazole (DIFLUCAN) 150 MG tablet -using topical estrogen. Follow up if not back to baseline after 3 days.   Family history of heart  disease - Plan: Ambulatory referral to Cardiology  Abnormal EKG - Plan: Ambulatory referral to Cardiology -discussed EKG mildly abnormal with NSR and nonspecific ST abnormalities. Read by myself and Dr. Susann Givens. She would like to go ahead with referral to cardiology. Concerned due to family history.

## 2020-07-24 NOTE — Patient Instructions (Signed)

## 2020-07-25 ENCOUNTER — Encounter: Payer: Self-pay | Admitting: Family Medicine

## 2020-07-25 ENCOUNTER — Ambulatory Visit: Payer: BC Managed Care – PPO | Admitting: Family Medicine

## 2020-07-25 ENCOUNTER — Other Ambulatory Visit: Payer: Self-pay

## 2020-07-25 VITALS — BP 110/70 | HR 82 | Ht 62.5 in | Wt 140.0 lb

## 2020-07-25 DIAGNOSIS — M8580 Other specified disorders of bone density and structure, unspecified site: Secondary | ICD-10-CM | POA: Diagnosis not present

## 2020-07-25 DIAGNOSIS — B373 Candidiasis of vulva and vagina: Secondary | ICD-10-CM | POA: Diagnosis not present

## 2020-07-25 DIAGNOSIS — Z136 Encounter for screening for cardiovascular disorders: Secondary | ICD-10-CM

## 2020-07-25 DIAGNOSIS — Z1211 Encounter for screening for malignant neoplasm of colon: Secondary | ICD-10-CM

## 2020-07-25 DIAGNOSIS — E7800 Pure hypercholesterolemia, unspecified: Secondary | ICD-10-CM

## 2020-07-25 DIAGNOSIS — Z1329 Encounter for screening for other suspected endocrine disorder: Secondary | ICD-10-CM

## 2020-07-25 DIAGNOSIS — E559 Vitamin D deficiency, unspecified: Secondary | ICD-10-CM

## 2020-07-25 DIAGNOSIS — R9431 Abnormal electrocardiogram [ECG] [EKG]: Secondary | ICD-10-CM

## 2020-07-25 DIAGNOSIS — Z0000 Encounter for general adult medical examination without abnormal findings: Secondary | ICD-10-CM | POA: Diagnosis not present

## 2020-07-25 DIAGNOSIS — B3731 Acute candidiasis of vulva and vagina: Secondary | ICD-10-CM

## 2020-07-25 DIAGNOSIS — Z780 Asymptomatic menopausal state: Secondary | ICD-10-CM

## 2020-07-25 DIAGNOSIS — Z1239 Encounter for other screening for malignant neoplasm of breast: Secondary | ICD-10-CM

## 2020-07-25 DIAGNOSIS — Z8249 Family history of ischemic heart disease and other diseases of the circulatory system: Secondary | ICD-10-CM

## 2020-07-25 LAB — POCT WET PREP (WET MOUNT)
Clue Cells Wet Prep Whiff POC: NEGATIVE
Trichomonas Wet Prep HPF POC: ABSENT

## 2020-07-25 LAB — POCT URINALYSIS DIP (PROADVANTAGE DEVICE)
Glucose, UA: NEGATIVE mg/dL
Ketones, POC UA: NEGATIVE mg/dL
Leukocytes, UA: NEGATIVE
Nitrite, UA: NEGATIVE
Protein Ur, POC: NEGATIVE mg/dL
Specific Gravity, Urine: 1.03
Urobilinogen, Ur: NEGATIVE
pH, UA: 6 (ref 5.0–8.0)

## 2020-07-25 MED ORDER — FLUCONAZOLE 150 MG PO TABS: 150.0000 mg | ORAL_TABLET | Freq: Once | ORAL | 0 refills | Status: DC

## 2020-07-26 LAB — CBC WITH DIFFERENTIAL/PLATELET
Basophils Absolute: 0.1 10*3/uL (ref 0.0–0.2)
Basos: 1 %
EOS (ABSOLUTE): 0.1 10*3/uL (ref 0.0–0.4)
Eos: 2 %
Hematocrit: 41.4 % (ref 34.0–46.6)
Hemoglobin: 13.7 g/dL (ref 11.1–15.9)
Immature Grans (Abs): 0 10*3/uL (ref 0.0–0.1)
Immature Granulocytes: 0 %
Lymphocytes Absolute: 1.7 10*3/uL (ref 0.7–3.1)
Lymphs: 29 %
MCH: 30.5 pg (ref 26.6–33.0)
MCHC: 33.1 g/dL (ref 31.5–35.7)
MCV: 92 fL (ref 79–97)
Monocytes Absolute: 0.6 10*3/uL (ref 0.1–0.9)
Monocytes: 9 %
Neutrophils Absolute: 3.4 10*3/uL (ref 1.4–7.0)
Neutrophils: 59 %
Platelets: 220 10*3/uL (ref 150–450)
RBC: 4.49 x10E6/uL (ref 3.77–5.28)
RDW: 12.4 % (ref 11.7–15.4)
WBC: 5.9 10*3/uL (ref 3.4–10.8)

## 2020-07-26 LAB — VITAMIN D 25 HYDROXY (VIT D DEFICIENCY, FRACTURES): Vit D, 25-Hydroxy: 25.3 ng/mL — ABNORMAL LOW (ref 30.0–100.0)

## 2020-07-26 LAB — LIPID PANEL
Chol/HDL Ratio: 4.2 ratio (ref 0.0–4.4)
Cholesterol, Total: 235 mg/dL — ABNORMAL HIGH (ref 100–199)
HDL: 56 mg/dL (ref >39)
LDL Chol Calc (NIH): 152 mg/dL — ABNORMAL HIGH (ref 0–99)
Triglycerides: 152 mg/dL — ABNORMAL HIGH (ref 0–149)
VLDL Cholesterol Cal: 27 mg/dL (ref 5–40)

## 2020-07-26 LAB — COMPREHENSIVE METABOLIC PANEL
ALT: 19 IU/L (ref 0–32)
AST: 18 IU/L (ref 0–40)
Albumin/Globulin Ratio: 2 (ref 1.2–2.2)
Albumin: 4.5 g/dL (ref 3.8–4.9)
Alkaline Phosphatase: 97 IU/L (ref 48–121)
BUN/Creatinine Ratio: 15 (ref 9–23)
BUN: 17 mg/dL (ref 6–24)
Bilirubin Total: 0.5 mg/dL (ref 0.0–1.2)
CO2: 23 mmol/L (ref 20–29)
Calcium: 9.5 mg/dL (ref 8.7–10.2)
Chloride: 104 mmol/L (ref 96–106)
Creatinine, Ser: 1.1 mg/dL — ABNORMAL HIGH (ref 0.57–1.00)
GFR calc Af Amer: 64 mL/min/{1.73_m2} (ref >59)
GFR calc non Af Amer: 55 mL/min/{1.73_m2} — ABNORMAL LOW (ref >59)
Globulin, Total: 2.2 g/dL (ref 1.5–4.5)
Glucose: 93 mg/dL (ref 65–99)
Potassium: 4.6 mmol/L (ref 3.5–5.2)
Sodium: 142 mmol/L (ref 134–144)
Total Protein: 6.7 g/dL (ref 6.0–8.5)

## 2020-07-26 LAB — TSH: TSH: 1.03 u[IU]/mL (ref 0.450–4.500)

## 2020-07-26 LAB — T3: T3, Total: 148 ng/dL (ref 71–180)

## 2020-07-26 LAB — T4, FREE: Free T4: 1.3 ng/dL (ref 0.82–1.77)

## 2020-07-29 ENCOUNTER — Other Ambulatory Visit: Payer: Self-pay | Admitting: Family Medicine

## 2020-07-29 DIAGNOSIS — B3731 Acute candidiasis of vulva and vagina: Secondary | ICD-10-CM

## 2020-07-29 DIAGNOSIS — M8580 Other specified disorders of bone density and structure, unspecified site: Secondary | ICD-10-CM

## 2020-07-29 DIAGNOSIS — B373 Candidiasis of vulva and vagina: Secondary | ICD-10-CM

## 2020-07-29 DIAGNOSIS — Z1239 Encounter for other screening for malignant neoplasm of breast: Secondary | ICD-10-CM

## 2020-07-29 DIAGNOSIS — Z780 Asymptomatic menopausal state: Secondary | ICD-10-CM

## 2020-07-29 NOTE — Telephone Encounter (Signed)
Is this okay to refill? 

## 2020-08-15 ENCOUNTER — Other Ambulatory Visit: Payer: Self-pay

## 2020-08-15 ENCOUNTER — Ambulatory Visit: Payer: BC Managed Care – PPO

## 2020-08-15 DIAGNOSIS — Z7185 Encounter for immunization safety counseling: Secondary | ICD-10-CM

## 2020-08-17 ENCOUNTER — Other Ambulatory Visit: Payer: Self-pay | Admitting: Family Medicine

## 2020-08-17 DIAGNOSIS — N951 Menopausal and female climacteric states: Secondary | ICD-10-CM

## 2020-08-17 NOTE — Telephone Encounter (Signed)
Is this okay to refill? 

## 2020-08-17 NOTE — Telephone Encounter (Signed)
Left message for pt to call me back 

## 2020-08-17 NOTE — Telephone Encounter (Signed)
She and I discussed her seeing a gynecologist about this. Has she scheduled or can we refer her?

## 2020-08-17 NOTE — Telephone Encounter (Signed)
Pt wants referral and ok to refill this time per vickie

## 2020-08-25 LAB — COLOGUARD: Cologuard: NEGATIVE

## 2020-08-31 LAB — COLOGUARD: COLOGUARD: NEGATIVE

## 2020-09-06 ENCOUNTER — Encounter: Payer: Self-pay | Admitting: Family Medicine

## 2020-09-06 ENCOUNTER — Other Ambulatory Visit: Payer: Self-pay | Admitting: Internal Medicine

## 2020-09-07 ENCOUNTER — Ambulatory Visit: Payer: BC Managed Care – PPO | Admitting: Cardiovascular Disease

## 2020-09-14 ENCOUNTER — Ambulatory Visit (INDEPENDENT_AMBULATORY_CARE_PROVIDER_SITE_OTHER): Payer: BC Managed Care – PPO | Admitting: Nurse Practitioner

## 2020-09-14 ENCOUNTER — Encounter: Payer: Self-pay | Admitting: Nurse Practitioner

## 2020-09-14 ENCOUNTER — Other Ambulatory Visit: Payer: Self-pay

## 2020-09-14 VITALS — BP 118/80 | Ht 62.0 in | Wt 140.0 lb

## 2020-09-14 DIAGNOSIS — Z01419 Encounter for gynecological examination (general) (routine) without abnormal findings: Secondary | ICD-10-CM | POA: Diagnosis not present

## 2020-09-14 DIAGNOSIS — N898 Other specified noninflammatory disorders of vagina: Secondary | ICD-10-CM | POA: Diagnosis not present

## 2020-09-14 DIAGNOSIS — B373 Candidiasis of vulva and vagina: Secondary | ICD-10-CM | POA: Diagnosis not present

## 2020-09-14 DIAGNOSIS — B3731 Acute candidiasis of vulva and vagina: Secondary | ICD-10-CM

## 2020-09-14 DIAGNOSIS — N951 Menopausal and female climacteric states: Secondary | ICD-10-CM | POA: Diagnosis not present

## 2020-09-14 MED ORDER — ESTRADIOL 10 MCG VA TABS: ORAL_TABLET | VAGINAL | 10 refills | Status: DC

## 2020-09-14 MED ORDER — FLUCONAZOLE 150 MG PO TABS: 150.0000 mg | ORAL_TABLET | Freq: Once | ORAL | 0 refills | Status: AC

## 2020-09-14 NOTE — Patient Instructions (Signed)

## 2020-09-14 NOTE — Progress Notes (Signed)
° °  Karina Walsh 1961/09/24 [id number]   History:  59 y.o. G0 presents as new patient to establish care. Postmenopausal with no bleeding. Estradiol vaginal tablets for dryness and dyspareunia. She has been on this for about 5 years with good relief. Had yeast infection recently and was treated with 2 doses of Diflucan. She is stilll having some itching today. History of D&C, cervical polypectomy, osteopenia, Vitamin D deficiency and asymptomatic hematuria with negative urology workup.   Gynecologic History No LMP recorded. Patient is postmenopausal.   Last Pap: 03/2018. Results were: normal Last mammogram: 05/2018. Results were: normal Last colonoscopy: Never. Does cologuard Last Dexa: 05/2018. Results were: t-score -1.7 FRAX 8.0% / 0.8%  Past medical history, past surgical history, family history and social history were all reviewed and documented in the EPIC chart.  ROS:  A ROS was performed and pertinent positives and negatives are included.  Exam:  Vitals:   09/14/20 1456  BP: 118/80  Weight: 140 lb (63.5 kg)  Height: 5\' 2"  (1.575 m)   Body mass index is 25.61 kg/m.  General appearance:  Normal Thyroid:  Symmetrical, normal in size, without palpable masses or nodularity. Respiratory  Auscultation:  Clear without wheezing or rhonchi Cardiovascular  Auscultation:  Regular rate, without rubs, murmurs or gallops  Edema/varicosities:  Not grossly evident Abdominal  Soft,nontender, without masses, guarding or rebound.  Liver/spleen:  No organomegaly noted  Hernia:  None appreciated  Skin  Inspection:  Grossly normal   Breasts: Examined lying and sitting.   Right: Without masses, retractions, discharge or axillary adenopathy.   Left: Without masses, retractions, discharge or axillary adenopathy. Gentitourinary   Inguinal/mons:  Normal without inguinal adenopathy  External genitalia:  Normal  BUS/Urethra/Skene's glands:  Normal  Vagina:  White, curdled discharge, mild  erythema  Cervix:  Normal  Uterus:  Anteverted, normal in size, shape and contour.  Midline and mobile  Adnexa/parametria:     Rt: Without masses or tenderness.   Lt: Without masses or tenderness.  Anus and perineum: Normal   Assessment/Plan:  59 y.o. G0 for annual exam.   Well female exam with routine gynecological exam - Education provided on SBEs, importance of preventative screenings, current guidelines, high calcium diet, regular exercise, and multivitamin daily. Labs with primary care.  Screening for cervical cancer - normal pap history. Most recent 03/2018. Will repeat at 5 year interval per guidelines.   Screening for breast cancer - normal mammogram history. Overdue for mammogram. Scheduled 10/10/2020  Screening for osteoporosis - Scheduled for bone density 10/10/2020. Most recent Dexa 05/2018 showed t-score -1.7 without elevated FRAX.   Menopausal vaginal dryness - We discussed the risk of small systemic absorption through vaginal estrogen and the slight risks for blood clots and breast cancer. She has had good relief of dryness and painful intercourse. She would like to continue. Refill for Estradiol 10 mcg vaginal tablets for 1 year provided. We will reassess on a yearly basis.   Follow up in 1 year for annual        06/2018 [facility], 3:06 PM 09/14/2020

## 2020-09-14 NOTE — Addendum Note (Signed)
Addended by: Tito Dine on: 09/14/2020 03:54 PM   Modules accepted: Orders

## 2020-09-15 LAB — WET PREP FOR TRICH, YEAST, CLUE

## 2020-09-19 ENCOUNTER — Ambulatory Visit: Payer: BC Managed Care – PPO | Admitting: Cardiology

## 2020-09-21 ENCOUNTER — Ambulatory Visit: Payer: BC Managed Care – PPO | Admitting: Cardiology

## 2020-10-03 NOTE — Progress Notes (Signed)
Cardiology Office Note:    Date:  10/05/2020   ID:  Karina Walsh, DOB 1961-12-09, MRN [id number]  PCP:  Avanell Shackleton, NP-C  Cardiologist:  No primary care provider on file.  Electrophysiologist:  None   Referring MD: Avanell Shackleton, NP-C   Chief Complaint  Patient presents with  . Hyperlipidemia    History of Present Illness:    Karina Walsh is a 59 y.o. female with a hx of hyperlipidemia who is referred by Hetty Blend, NP for evaluation of cardiovascular risk assessment.  Family history includes father had MI and CABG in early 74s, mother had MI in 107s, oldest brother had MI in 85s, middle brother had PCI in 75s, youngest brother had MI in 49s.  Reports she walks 2 to 3 days/week for 15 minutes.  She denies any exertional chest pain or dyspnea.  Denies any lightheadedness, syncope, lower extremity edema, palpitations.  No smoking history.    Most recent lipid panel 07/25/2020 showed LDL 152.  Past Medical History:  Diagnosis Date  . Asymptomatic microscopic hematuria 03/21/2018  . Elevated LDL cholesterol level 07/24/2020  . H/O cervical polypectomy   . Vitamin D deficiency 07/24/2020    Past Surgical History:  Procedure Laterality Date  . DILATION AND CURETTAGE OF UTERUS  2002    Current Medications: Current Meds  Medication Sig  . Estradiol 10 MCG TABS vaginal tablet Insert vaginally twice weekly     Allergies:   Patient has no known allergies.   Social History   Socioeconomic History  . Marital status: Married    Spouse name: Not on file  . Number of children: Not on file  . Years of education: Not on file  . Highest education level: Not on file  Occupational History  . Not on file  Tobacco Use  . Smoking status: Never Smoker  . Smokeless tobacco: Never Used  Substance and Sexual Activity  . Alcohol use: No  . Drug use: No  . Sexual activity: Yes    Birth control/protection: None  Other Topics Concern  . Not on file  Social History Narrative  .  Not on file   Social Determinants of Health   Financial Resource Strain:   . Difficulty of Paying Living Expenses: Not on file  Food Insecurity:   . Worried About Programme researcher, broadcasting/film/video in the Last Year: Not on file  . Ran Out of Food in the Last Year: Not on file  Transportation Needs:   . Lack of Transportation (Medical): Not on file  . Lack of Transportation (Non-Medical): Not on file  Physical Activity:   . Days of Exercise per Week: Not on file  . Minutes of Exercise per Session: Not on file  Stress:   . Feeling of Stress : Not on file  Social Connections:   . Frequency of Communication with Friends and Family: Not on file  . Frequency of Social Gatherings with Friends and Family: Not on file  . Attends Religious Services: Not on file  . Active Member of Clubs or Organizations: Not on file  . Attends Banker Meetings: Not on file  . Marital Status: Not on file     Family History: The patient's family history includes Cancer (age of onset: 12) in her sister; Diabetes in her father; Heart attack (age of onset: 69) in her mother; Heart disease in her brother and father; Heart disease (age of onset: 72) in her brother; Hyperlipidemia in her  father; Hypertension in her father and mother; Kidney disease in her father; Stroke in her father; Suicidality in her brother; Throat cancer in her brother.  ROS:   Please see the history of present illness.     All other systems reviewed and are negative.  EKGs/Labs/Other Studies Reviewed:    The following studies were reviewed today:   EKG:  EKG is  ordered today.  The ekg ordered today demonstrates normal sinus rhythm, rate 70, nonspecific T wave flattening, motion artifact  Recent Labs: 07/25/2020: ALT 19; BUN 17; Creatinine, Ser 1.10; Hemoglobin 13.7; Platelets 220; Potassium 4.6; Sodium 142; TSH 1.030  Recent Lipid Panel    Component Value Date/Time   CHOL 235 (H) 07/25/2020 1000   TRIG 152 (H) 07/25/2020 1000   HDL 56  07/25/2020 1000   CHOLHDL 4.2 07/25/2020 1000   CHOLHDL 4.4 08/31/2011 1108   VLDL 21 08/31/2011 1108   LDLCALC 152 (H) 07/25/2020 1000    Physical Exam:    VS:  BP 112/80   Pulse 70   Ht 5\' 3"  (1.6 m)   Wt 141 lb (64 kg)   SpO2 98%   BMI 24.98 kg/m     Wt Readings from Last 3 Encounters:  10/05/20 141 lb (64 kg)  09/14/20 140 lb (63.5 kg)  07/25/20 140 lb (63.5 kg)     GEN:  Well nourished, well developed in no acute distress HEENT: Normal NECK: No JVD; No carotid bruits LYMPHATICS: No lymphadenopathy CARDIAC: RRR, no murmurs, rubs, gallops RESPIRATORY:  Clear to auscultation without rales, wheezing or rhonchi  ABDOMEN: Soft, non-tender, non-distended MUSCULOSKELETAL:  No edema; No deformity  SKIN: Warm and dry NEUROLOGIC:  Alert and oriented x 3 PSYCHIATRIC:  Normal affect   ASSESSMENT:    1. Hyperlipidemia, unspecified hyperlipidemia type   2. Family history of coronary artery disease    PLAN:    Hyperlipidemia: lipid panel 07/25/2020 showed LDL 152.  10-year ASCVD risk score is 2.6%.  Would not have any indication for statin at this time.  However given her family history, will further risk stratify with calcium score.  RTC in 1 year  Medication Adjustments/Labs and Tests Ordered: Current medicines are reviewed at length with the patient today.  Concerns regarding medicines are outlined above.  Orders Placed This Encounter  Procedures  . CT CARDIAC SCORING  . EKG 12-Lead   No orders of the defined types were placed in this encounter.   Patient Instructions  Medication Instructions:  Your physician recommends that you continue on your current medications as directed. Please refer to the Current Medication list given to you today.  Testing/Procedures: CT coronary calcium score. This test is done at [address]. This is $150 out of pocket.   Coronary CalciumScan A coronary calcium scan is an imaging test used to look for deposits of  calcium and other fatty materials (plaques) in the inner lining of the blood vessels of the heart (coronary arteries). These deposits of calcium and plaques can partly clog and narrow the coronary arteries without producing any symptoms or warning signs. This puts a person at risk for a heart attack. This test can detect these deposits before symptoms develop. Tell a health care provider about:  Any allergies you have.  All medicines you are taking, including vitamins, herbs, eye drops, creams, and over-the-counter medicines.  Any problems you or family members have had with anesthetic medicines.  Any blood disorders you have.  Any surgeries you  have had.  Any medical conditions you have.  Whether you are pregnant or may be pregnant. What are the risks? Generally, this is a safe procedure. However, problems may occur, including:  Harm to a pregnant woman and her unborn baby. This test involves the use of radiation. Radiation exposure can be dangerous to a pregnant woman and her unborn baby. If you are pregnant, you generally should not have this procedure done.  Slight increase in the risk of cancer. This is because of the radiation involved in the test. What happens before the procedure? No preparation is needed for this procedure. What happens during the procedure?  You will undress and remove any jewelry around your neck or chest.  You will put on a hospital gown.  Sticky electrodes will be placed on your chest. The electrodes will be connected to an electrocardiogram (ECG) machine to record a tracing of the electrical activity of your heart.  A CT scanner will take pictures of your heart. During this time, you will be asked to lie still and hold your breath for 2-3 seconds while a picture of your heart is being taken. The procedure may vary among health care providers and hospitals. What happens after the procedure?  You can get dressed.  You can return to your normal  activities.  It is up to you to get the results of your test. Ask your health care provider, or the department that is doing the test, when your results will be ready. Summary  A coronary calcium scan is an imaging test used to look for deposits of calcium and other fatty materials (plaques) in the inner lining of the blood vessels of the heart (coronary arteries).  Generally, this is a safe procedure. Tell your health care provider if you are pregnant or may be pregnant.  No preparation is needed for this procedure.  A CT scanner will take pictures of your heart.  You can return to your normal activities after the scan is done. This information is not intended to replace advice given to you by your health care provider. Make sure you discuss any questions you have with your health care provider. Document Released: 05/31/2008 Document Revised: 10/22/2016 Document Reviewed: 10/22/2016 Elsevier Interactive Patient Education  2017 [employer].   Follow-Up: At [facility], you and your health needs are our priority.  As part of our continuing mission to provide you with exceptional heart care, we have created designated Provider Care Teams.  These Care Teams include your primary Cardiologist (physician) and Advanced Practice Providers (APPs -  Physician Assistants and Nurse Practitioners) who all work together to provide you with the care you need, when you need it.  We recommend signing up for the patient portal called "MyChart".  Sign up information is provided on this After Visit Summary.  MyChart is used to connect with patients for Virtual Visits (Telemedicine).  Patients are able to view lab/test results, encounter notes, upcoming appointments, etc.  Non-urgent messages can be sent to your provider as well.   To learn more about what you can do with MyChart, go to [email or web address].    Your next appointment:   12 month(s)  The format for your next appointment:   In  Person  Provider:   Epifanio Lesches, MD       Signed, Little Ishikawa, MD  10/05/2020 10:55 PM    [facility]

## 2020-10-05 ENCOUNTER — Ambulatory Visit: Payer: BC Managed Care – PPO | Admitting: Cardiology

## 2020-10-05 ENCOUNTER — Other Ambulatory Visit: Payer: Self-pay

## 2020-10-05 VITALS — BP 112/80 | HR 70 | Ht 63.0 in | Wt 141.0 lb

## 2020-10-05 DIAGNOSIS — E785 Hyperlipidemia, unspecified: Secondary | ICD-10-CM | POA: Diagnosis not present

## 2020-10-05 DIAGNOSIS — Z8249 Family history of ischemic heart disease and other diseases of the circulatory system: Secondary | ICD-10-CM

## 2020-10-05 NOTE — Patient Instructions (Signed)
Medication Instructions:  Your physician recommends that you continue on your current medications as directed. Please refer to the Current Medication list given to you today.  Testing/Procedures: CT coronary calcium score. This test is done at [address] [address]. This is $150 out of pocket.   Coronary CalciumScan A coronary calcium scan is an imaging test used to look for deposits of calcium and other fatty materials (plaques) in the inner lining of the blood vessels of the heart (coronary arteries). These deposits of calcium and plaques can partly clog and narrow the coronary arteries without producing any symptoms or warning signs. This puts a person at risk for a heart attack. This test can detect these deposits before symptoms develop. Tell a health care provider about:  Any allergies you have.  All medicines you are taking, including vitamins, herbs, eye drops, creams, and over-the-counter medicines.  Any problems you or family members have had with anesthetic medicines.  Any blood disorders you have.  Any surgeries you have had.  Any medical conditions you have.  Whether you are pregnant or may be pregnant. What are the risks? Generally, this is a safe procedure. However, problems may occur, including:  Harm to a pregnant woman and her unborn baby. This test involves the use of radiation. Radiation exposure can be dangerous to a pregnant woman and her unborn baby. If you are pregnant, you generally should not have this procedure done.  Slight increase in the risk of cancer. This is because of the radiation involved in the test. What happens before the procedure? No preparation is needed for this procedure. What happens during the procedure?  You will undress and remove any jewelry around your neck or chest.  You will put on a hospital gown.  Sticky electrodes will be placed on your chest. The electrodes will be connected to an electrocardiogram (ECG) machine  to record a tracing of the electrical activity of your heart.  A CT scanner will take pictures of your heart. During this time, you will be asked to lie still and hold your breath for 2-3 seconds while a picture of your heart is being taken. The procedure may vary among health care providers and hospitals. What happens after the procedure?  You can get dressed.  You can return to your normal activities.  It is up to you to get the results of your test. Ask your health care provider, or the department that is doing the test, when your results will be ready. Summary  A coronary calcium scan is an imaging test used to look for deposits of calcium and other fatty materials (plaques) in the inner lining of the blood vessels of the heart (coronary arteries).  Generally, this is a safe procedure. Tell your health care provider if you are pregnant or may be pregnant.  No preparation is needed for this procedure.  A CT scanner will take pictures of your heart.  You can return to your normal activities after the scan is done. This information is not intended to replace advice given to you by your health care provider. Make sure you discuss any questions you have with your health care provider. Document Released: 05/31/2008 Document Revised: 10/22/2016 Document Reviewed: 10/22/2016 Elsevier Interactive Patient Education  2017 [employer].   Follow-Up: At [facility], you and your health needs are our priority.  As part of our continuing mission to provide you with exceptional heart care, we have created designated Provider Care Teams.  These  Care Teams include your primary Cardiologist (physician) and Advanced Practice Providers (APPs -  Physician Assistants and Nurse Practitioners) who all work together to provide you with the care you need, when you need it.  We recommend signing up for the patient portal called "MyChart".  Sign up information is provided on this After Visit Summary.   MyChart is used to connect with patients for Virtual Visits (Telemedicine).  Patients are able to view lab/test results, encounter notes, upcoming appointments, etc.  Non-urgent messages can be sent to your provider as well.   To learn more about what you can do with MyChart, go to [email or web address].    Your next appointment:   12 month(s)  The format for your next appointment:   In Person  Provider:   Christopher Schumann, MD     

## 2020-10-10 ENCOUNTER — Ambulatory Visit
Admission: RE | Admit: 2020-10-10 | Discharge: 2020-10-10 | Disposition: A | Discharge status: Home / Self Care | Payer: BC Managed Care – PPO | Source: Ambulatory Visit | Attending: Family Medicine | Admitting: Family Medicine

## 2020-10-10 ENCOUNTER — Other Ambulatory Visit: Payer: Self-pay

## 2020-10-10 DIAGNOSIS — Z780 Asymptomatic menopausal state: Secondary | ICD-10-CM

## 2020-10-10 DIAGNOSIS — M8580 Other specified disorders of bone density and structure, unspecified site: Secondary | ICD-10-CM

## 2020-10-10 DIAGNOSIS — Z1239 Encounter for other screening for malignant neoplasm of breast: Secondary | ICD-10-CM

## 2020-10-13 ENCOUNTER — Telehealth: Payer: Self-pay | Admitting: *Deleted

## 2020-10-13 ENCOUNTER — Telehealth: Payer: Self-pay | Admitting: Internal Medicine

## 2020-10-13 ENCOUNTER — Other Ambulatory Visit: Payer: Self-pay | Admitting: Nurse Practitioner

## 2020-10-13 DIAGNOSIS — N952 Postmenopausal atrophic vaginitis: Secondary | ICD-10-CM

## 2020-10-13 MED ORDER — CLOBETASOL PROPIONATE 0.05 % EX OINT: 1.0000 "application " | TOPICAL_OINTMENT | Freq: Two times a day (BID) | CUTANEOUS | 0 refills | Status: DC

## 2020-10-13 NOTE — Telephone Encounter (Signed)
If the diflucan is not helping her symptoms then I recommend she be seen and have another exam and testing done to see if there is anything else going on. She can see if her gynecologist can see her for this or come back in here.

## 2020-10-13 NOTE — Telephone Encounter (Signed)
Pt called and states that she has had 3 rounds of diflucan for her yeast issues and it still has not gone away. She did go see obgyn for her hormones. Can you prescribe her something else or do I need to tell her to discuss this with obgyn since she now sees one

## 2020-10-13 NOTE — Telephone Encounter (Signed)
It does not sound like a yeast infection if the OTC treatments are making her symptoms worse. If the discomfort is mostly external we can try a cream. If it is also internal I recommend she come in for evaluation.

## 2020-10-13 NOTE — Telephone Encounter (Signed)
Patient said she would like to try a cream to use externally.

## 2020-10-13 NOTE — Telephone Encounter (Signed)
I sent Clobetasol ointment to her pharmacy. Apply externally twice daily for 10-14 days. If symptoms do not improve she will need to schedule an OV.

## 2020-10-13 NOTE — Telephone Encounter (Signed)
Pt scheduled for Wednesday but will contact obgyn to see if something else is better

## 2020-10-13 NOTE — Telephone Encounter (Signed)
Patient called c/o vaginal itching only,patient completed Diflucan dose at OV on 09/14/20,symptoms did stop however returned. reports she tried Monistat and Vagasil and it made the itching worse. No discharge, no odor. Patient would like recommendations to help with itching. Please advise

## 2020-10-19 ENCOUNTER — Other Ambulatory Visit: Payer: Self-pay

## 2020-10-19 ENCOUNTER — Ambulatory Visit (INDEPENDENT_AMBULATORY_CARE_PROVIDER_SITE_OTHER)
Admission: RE | Admit: 2020-10-19 | Discharge: 2020-10-19 | Disposition: A | Discharge status: Home / Self Care | Payer: Self-pay | Source: Ambulatory Visit | Attending: Cardiology | Admitting: Cardiology

## 2020-10-19 ENCOUNTER — Ambulatory Visit: Payer: BC Managed Care – PPO | Admitting: Family Medicine

## 2020-10-19 DIAGNOSIS — Z8249 Family history of ischemic heart disease and other diseases of the circulatory system: Secondary | ICD-10-CM

## 2020-12-10 IMAGING — MG DIGITAL SCREENING BILAT W/ CAD
4 series · 4 of 4 positions shown · non-contrast
Comparison: Previous exam(s).

CLINICAL DATA: Screening.

EXAM:
DIGITAL SCREENING BILATERAL MAMMOGRAM WITH CAD

[L CC]
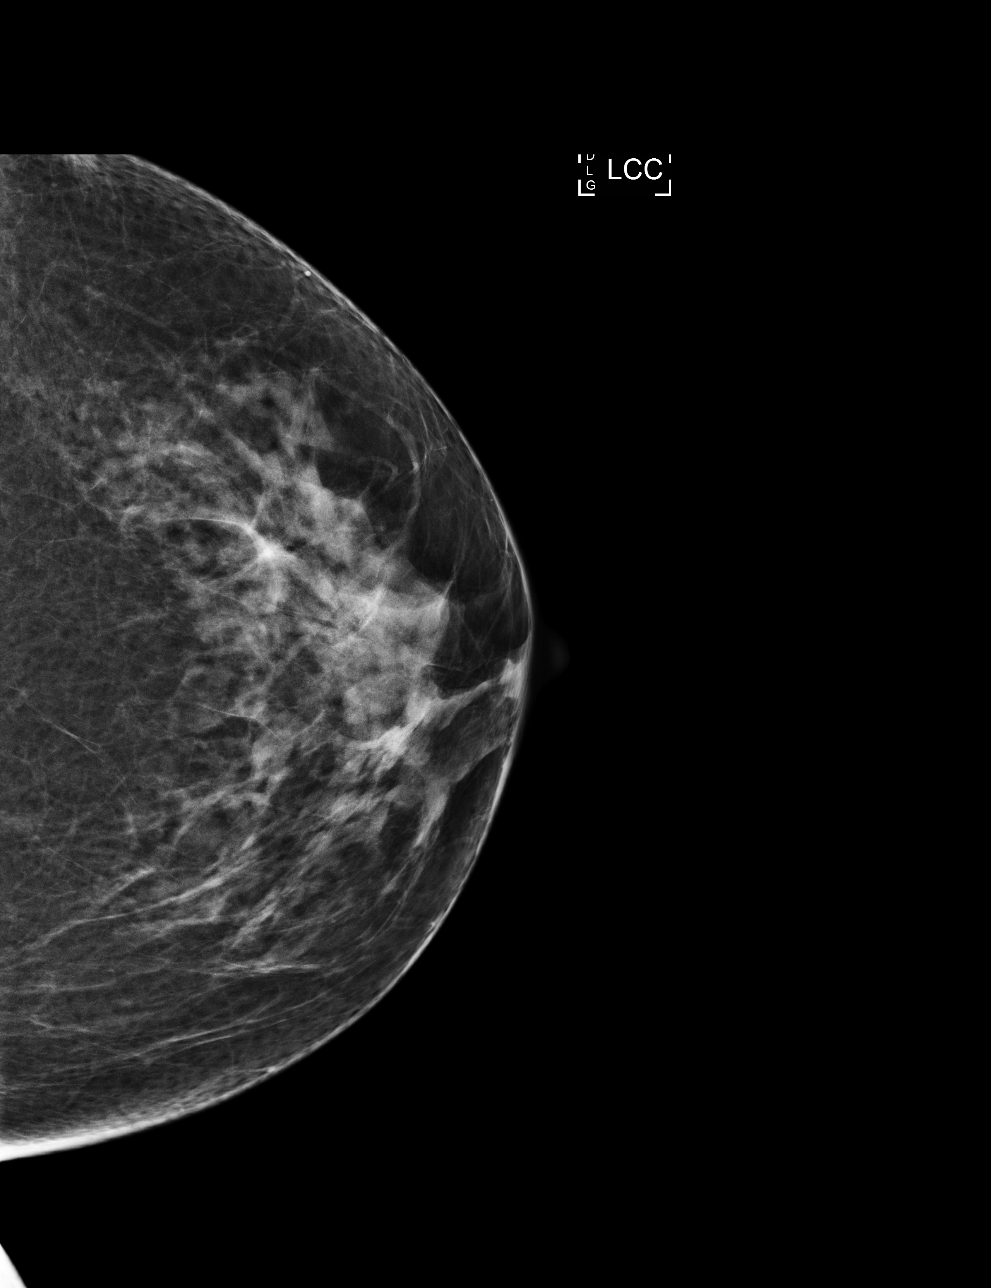

[R MLO]
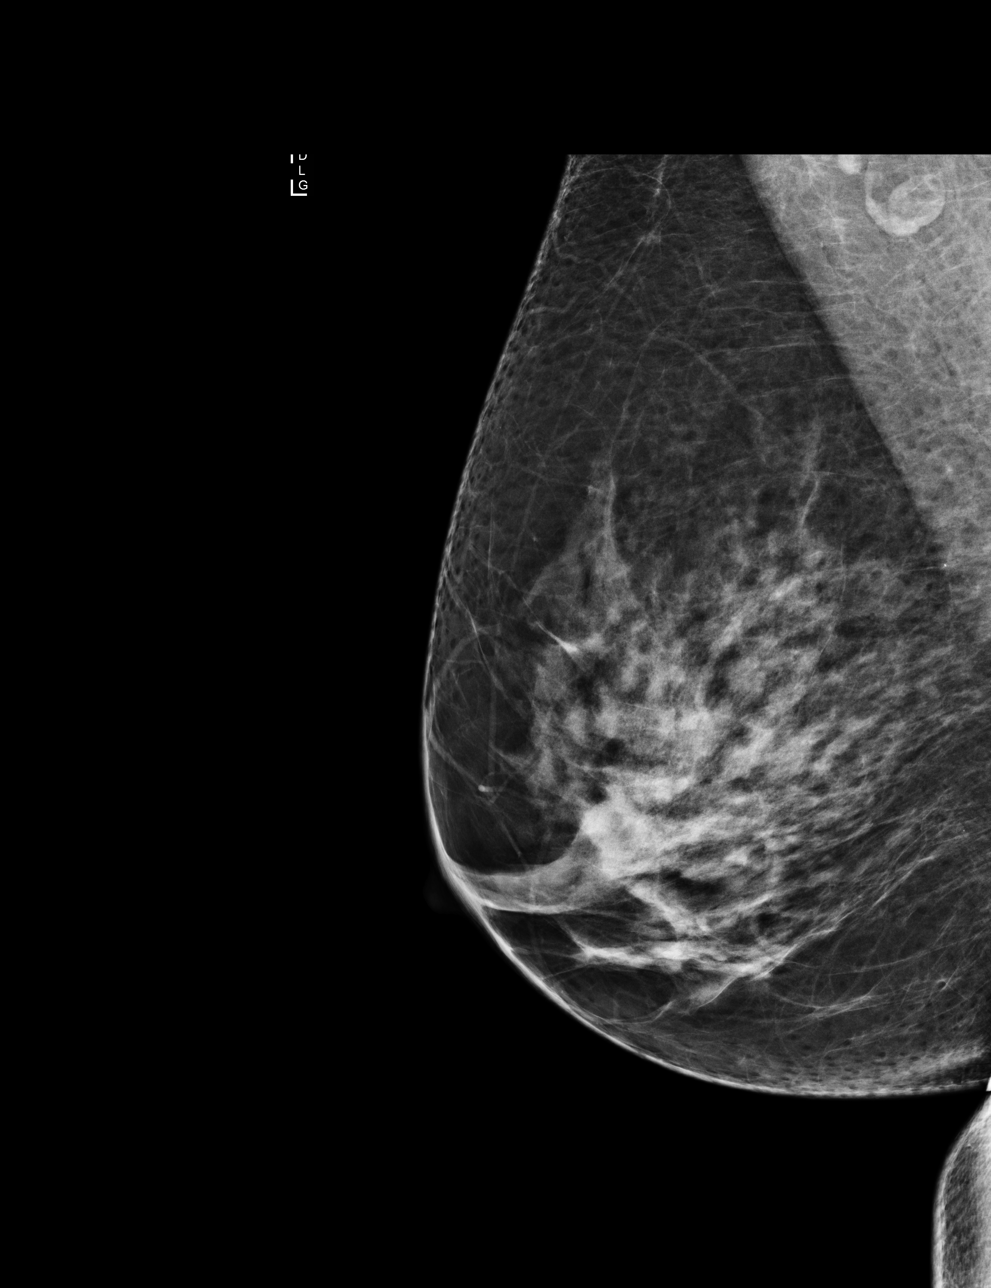

[R CC]
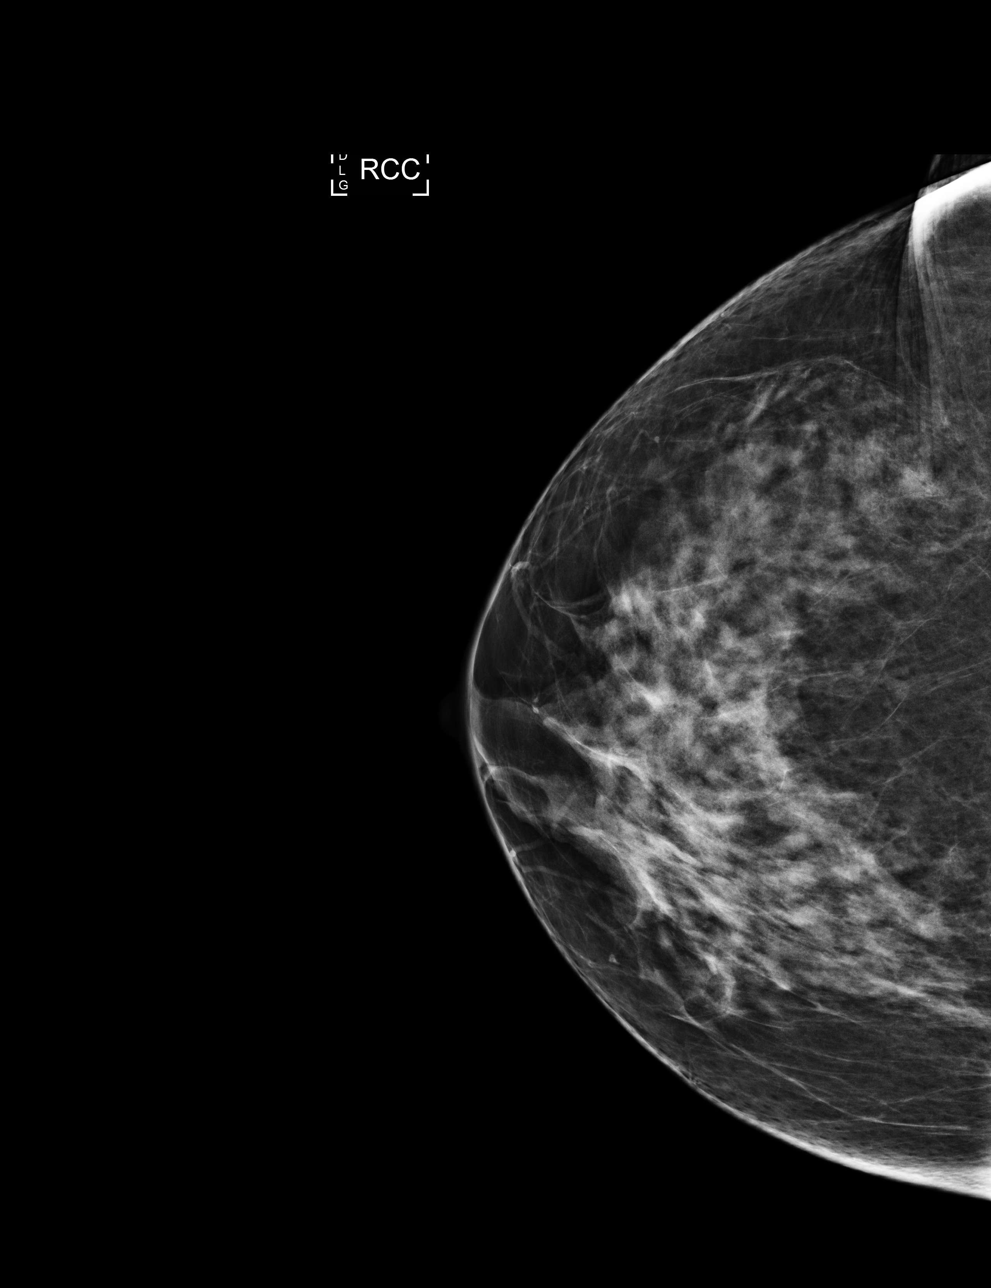

[L MLO]
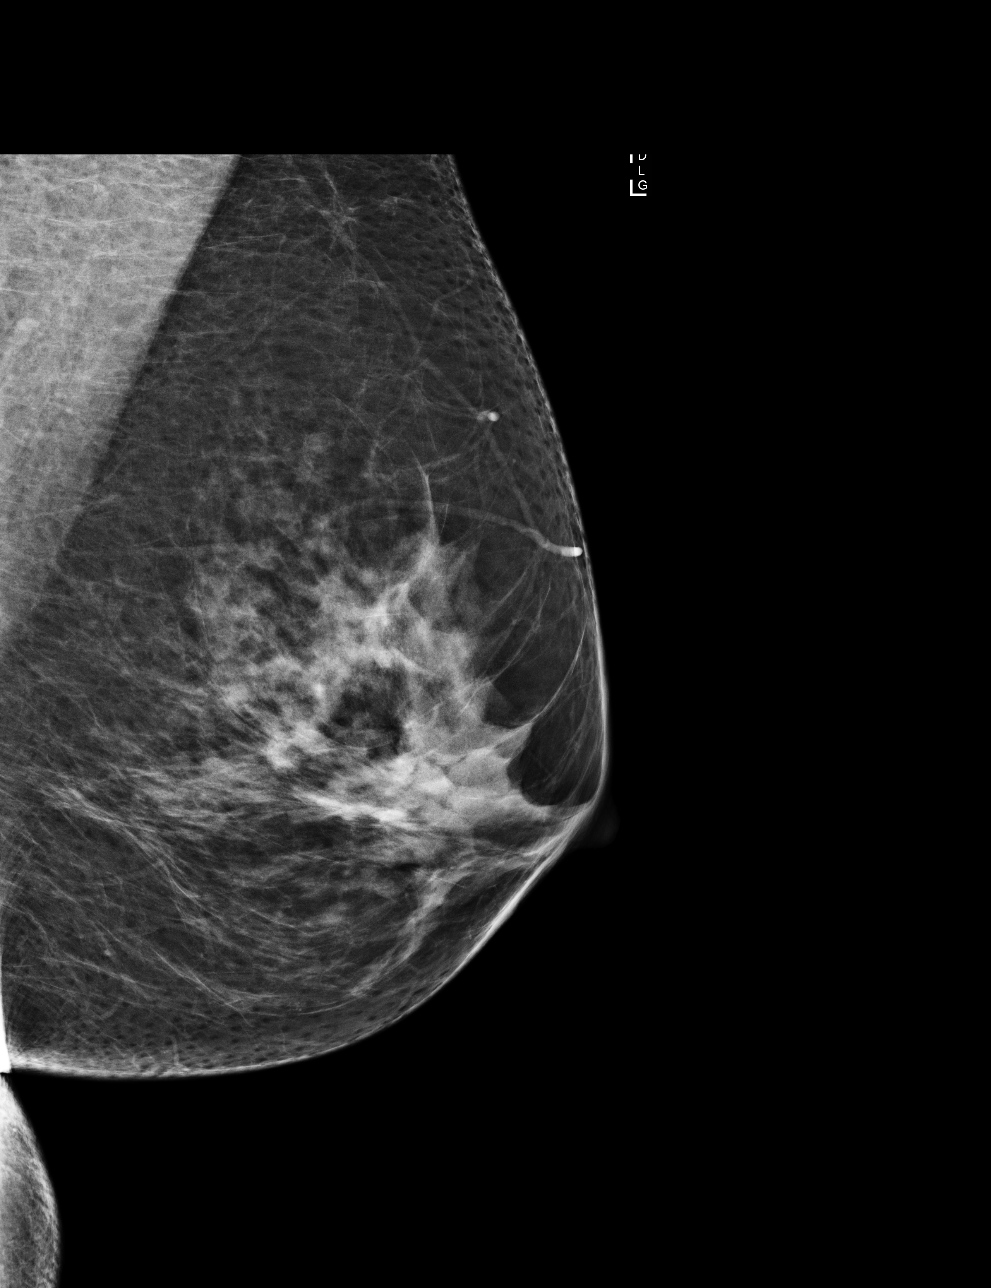

[4 of 4 positions shown; findings below may reference images not displayed]

ACR Breast Density Category c: The breast tissue is heterogeneously
dense, which may obscure small masses.
FINDINGS: There are no findings suspicious for malignancy. Images were
processed with CAD.
IMPRESSION: No mammographic evidence of malignancy. A result letter of this
screening mammogram will be mailed directly to the patient.

RECOMMENDATION:
Screening mammogram in one year. (Code:[id number])

BI-RADS CATEGORY  1: Negative.

## 2021-03-28 ENCOUNTER — Encounter: Payer: Self-pay | Admitting: Internal Medicine

## 2021-06-14 ENCOUNTER — Encounter: Payer: Self-pay | Admitting: Internal Medicine

## 2021-07-26 ENCOUNTER — Encounter: Payer: BC Managed Care – PPO | Admitting: Family Medicine

## 2021-08-06 ENCOUNTER — Other Ambulatory Visit: Payer: Self-pay | Admitting: Nurse Practitioner

## 2021-08-06 DIAGNOSIS — N951 Menopausal and female climacteric states: Secondary | ICD-10-CM

## 2021-09-13 ENCOUNTER — Other Ambulatory Visit: Payer: Self-pay | Admitting: Nurse Practitioner

## 2021-09-13 DIAGNOSIS — N951 Menopausal and female climacteric states: Secondary | ICD-10-CM

## 2021-09-14 ENCOUNTER — Other Ambulatory Visit: Payer: Self-pay

## 2021-09-14 DIAGNOSIS — N951 Menopausal and female climacteric states: Secondary | ICD-10-CM

## 2021-09-14 MED ORDER — ESTRADIOL 10 MCG VA TABS: ORAL_TABLET | VAGINAL | 0 refills | Status: DC

## 2021-09-14 NOTE — Telephone Encounter (Signed)
Patient called and scheduled AEX for 09/27/21.   Last AEX 09/05/2020.  Requesting Estradiol tab refill.

## 2021-09-27 ENCOUNTER — Other Ambulatory Visit: Payer: Self-pay

## 2021-09-27 ENCOUNTER — Ambulatory Visit (INDEPENDENT_AMBULATORY_CARE_PROVIDER_SITE_OTHER): Payer: BC Managed Care – PPO | Admitting: Nurse Practitioner

## 2021-09-27 ENCOUNTER — Encounter: Payer: Self-pay | Admitting: Nurse Practitioner

## 2021-09-27 VITALS — BP 118/74 | Ht 62.0 in | Wt 143.0 lb

## 2021-09-27 DIAGNOSIS — Z01419 Encounter for gynecological examination (general) (routine) without abnormal findings: Secondary | ICD-10-CM

## 2021-09-27 DIAGNOSIS — M8580 Other specified disorders of bone density and structure, unspecified site: Secondary | ICD-10-CM

## 2021-09-27 DIAGNOSIS — N951 Menopausal and female climacteric states: Secondary | ICD-10-CM

## 2021-09-27 DIAGNOSIS — Z780 Asymptomatic menopausal state: Secondary | ICD-10-CM | POA: Diagnosis not present

## 2021-09-27 DIAGNOSIS — L900 Lichen sclerosus et atrophicus: Secondary | ICD-10-CM

## 2021-09-27 MED ORDER — ESTRADIOL 10 MCG VA TABS: ORAL_TABLET | VAGINAL | 3 refills | Status: DC

## 2021-09-27 MED ORDER — CLOBETASOL PROPIONATE 0.05 % EX OINT: 1.0000 "application " | TOPICAL_OINTMENT | Freq: Two times a day (BID) | CUTANEOUS | 0 refills | Status: AC

## 2021-09-27 NOTE — Progress Notes (Signed)
   Karina Walsh 1961/02/20 [id number]   History:  60 y.o. G0 presents as new patient to establish care. Postmenopausal. Using vaginal estradiol tablets for dryness and dyspareunia. Complains of intermittent vulvar itching and irritation. She has Clobetasol ointment at home and says this helps and she uses it occasionally. History of osteopenia, Vitamin D deficiency and asymptomatic hematuria with negative urology workup.   Gynecologic History No LMP recorded. Patient is postmenopausal.   Sexually active: Yes  Health Maintenance Last Pap: 03/20/2018. Results were: normal, 5-year repeat Last mammogram: 10/10/2020. Results were: normal Last colonoscopy: Never. Negative Cologuard 08/25/2020 Last Dexa: 10/10/2020. Results were: T-score -1.8, FRAX 8.9% / 1.0%  Past medical history, past surgical history, family history and social history were all reviewed and documented in the EPIC chart. 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 Sister deceased from pancreatic cancer.   ROS:  A ROS was performed and pertinent positives and negatives are included.  Exam:  Vitals:   09/28/59 1456  BP: 118/74  Weight: 143 lb (64.9 kg)  Height: 5\' 2"  (1.575 m)    Body mass index is 26.16 kg/m.  General appearance:  Normal Thyroid:  Symmetrical, normal in size, without palpable masses or nodularity. Respiratory  Auscultation:  Clear without wheezing or rhonchi Cardiovascular  Auscultation:  Regular rate, without rubs, murmurs or gallops  Edema/varicosities:  Not grossly evident Abdominal  Soft,nontender, without masses, guarding or rebound.  Liver/spleen:  No organomegaly noted  Hernia:  None appreciated  Skin  Inspection:  Grossly normal   Breasts: Examined lying and sitting.   Right: Without masses, retractions, discharge or axillary adenopathy.   Left: Without masses, retractions, discharge or axillary adenopathy. Genitourinary   Inguinal/mons:  Normal without inguinal  adenopathy  External genitalia:  Pearly appearance in figure 8 pattern consistent with LS  BUS/Urethra/Skene's glands:  Normal  Vagina:  Atrophic changes  Cervix:  Normal appearing without discharge or lesions  Uterus:  Normal in size, shape and contour.  Midline and mobile, nontender  Adnexa/parametria:     Rt: Normal in size, without masses or tenderness.   Lt: Normal in size, without masses or tenderness.  Anus and perineum: Normal  Digital rectal exam: Normal sphincter tone without palpated masses or tenderness  Patient informed chaperone available to be present for breast and pelvic exam. Patient has requested no chaperone to be present. Patient has been advised what will be completed during breast and pelvic exam.    Assessment/Plan:  60 y.o. G0 for annual exam.   Well female exam with routine gynecological exam - Education provided on SBEs, importance of preventative screenings, current guidelines, high calcium diet, regular exercise, and multivitamin daily. Labs with PCP.  Postmenopausal - no HRT, no bleeding  Vaginal dryness, menopausal - Plan: Estradiol 10 MCG TABS vaginal tablet twice weekly. Doing well on this and would like to continue. Refill x 1 year provided.   Osteopenia, unspecified location - managed by PCP. Recommend Vitamin D + Calcium supplement and regular exercise.   Lichen sclerosus - Plan: clobetasol ointment (TEMOVATE) 0.05 % twice weekly.   Screening for cervical cancer - normal pap history. Will repeat at 5 year interval per guidelines.   Screening for breast cancer - normal mammogram history. Continue annual screenings. Normal breast exam today.   Screening for colon cancer - Negative Cologuard 08/2020.   Follow up in 1 year for annual.       09/2020 [facility], 3:18 PM 09/27/2021

## 2022-02-21 ENCOUNTER — Other Ambulatory Visit: Payer: Self-pay | Admitting: Nurse Practitioner

## 2022-02-21 DIAGNOSIS — N951 Menopausal and female climacteric states: Secondary | ICD-10-CM

## 2022-10-31 ENCOUNTER — Encounter: Payer: Self-pay | Admitting: Internal Medicine

## 2024-06-03 ENCOUNTER — Encounter: Payer: Self-pay | Admitting: Nurse Practitioner

## 2024-06-03 ENCOUNTER — Ambulatory Visit (INDEPENDENT_AMBULATORY_CARE_PROVIDER_SITE_OTHER): Payer: Self-pay | Admitting: Nurse Practitioner

## 2024-06-03 ENCOUNTER — Other Ambulatory Visit (HOSPITAL_COMMUNITY)
Admission: RE | Admit: 2024-06-03 | Discharge: 2024-06-03 | Disposition: A | Discharge status: Home / Self Care | Source: Ambulatory Visit | Attending: Nurse Practitioner | Admitting: Nurse Practitioner

## 2024-06-03 VITALS — BP 112/70 | HR 74 | Ht 62.25 in | Wt 132.0 lb

## 2024-06-03 DIAGNOSIS — Z780 Asymptomatic menopausal state: Secondary | ICD-10-CM | POA: Diagnosis not present

## 2024-06-03 DIAGNOSIS — M8580 Other specified disorders of bone density and structure, unspecified site: Secondary | ICD-10-CM

## 2024-06-03 DIAGNOSIS — L900 Lichen sclerosus et atrophicus: Secondary | ICD-10-CM

## 2024-06-03 DIAGNOSIS — Z124 Encounter for screening for malignant neoplasm of cervix: Secondary | ICD-10-CM | POA: Insufficient documentation

## 2024-06-03 DIAGNOSIS — Z01419 Encounter for gynecological examination (general) (routine) without abnormal findings: Secondary | ICD-10-CM | POA: Diagnosis present

## 2024-06-03 DIAGNOSIS — N898 Other specified noninflammatory disorders of vagina: Secondary | ICD-10-CM | POA: Diagnosis not present

## 2024-06-03 DIAGNOSIS — Z1331 Encounter for screening for depression: Secondary | ICD-10-CM | POA: Diagnosis not present

## 2024-06-03 MED ORDER — ESTRADIOL 10 MCG VA TABS: 1.0000 | ORAL_TABLET | VAGINAL | 3 refills | Status: AC

## 2024-06-03 NOTE — Progress Notes (Signed)
 Karina Walsh 12/06/1961 [id number]   History:  63 y.o. G0 presents for annual exam. Postmenopausal. Using clobetasol  as needed for lichen sclerosus. Has not had to use much lately. Painful intercourse. Did vaginal estrogen in the past and is interested in restarting. History of osteopenia, Vitamin D  deficiency and asymptomatic hematuria with negative urology workup.   Gynecologic History No LMP recorded. Patient is postmenopausal.   Sexually active: Yes  Health Maintenance Last Pap: 03/20/2018. Results were: Normal neg HPV Last mammogram: 09/20/2023. Results were: normal Last colonoscopy: Never. Negative Cologuard 08/25/2020 Last Dexa: 10/10/2020. Results were: T-score -1.8, FRAX 8.9% / 1.0%  Past medical history, past surgical history, family history and social history were all reviewed and documented in the EPIC chart. 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 Significant family history of heart disease. Sister deceased from pancreatic cancer.   ROS:  A ROS was performed and pertinent positives and negatives are included.  Exam:  Vitals:   06/03/24 0914  BP: 112/70  Pulse: 74  SpO2: 97%  Weight: 132 lb (59.9 kg)  Height: 5' 2.25 (1.581 m)     Body mass index is 23.95 kg/m.  General appearance:  Normal Thyroid:  Symmetrical, normal in size, without palpable masses or nodularity. Respiratory  Auscultation:  Clear without wheezing or rhonchi Cardiovascular  Auscultation:  Regular rate, without rubs, murmurs or gallops  Edema/varicosities:  Not grossly evident Abdominal  Soft,nontender, without masses, guarding or rebound.  Liver/spleen:  No organomegaly noted  Hernia:  None appreciated  Skin  Inspection:  Grossly normal   Breasts: Examined lying and sitting.   Right: Without masses, retractions, discharge or axillary adenopathy.   Left: Without masses, retractions, discharge or axillary adenopathy. Pelvic: External genitalia:  no lesions. Wrinkled, pearly appearance c/w  LS              Urethra:  normal appearing urethra with no masses, tenderness or lesions              Bartholins and Skenes: normal                 Vagina: normal appearing vagina with normal color and discharge, no lesions. Atrophic changes              Cervix: no lesions Bimanual Exam:  Uterus:  no masses or tenderness              Adnexa: no mass, fullness, tenderness              Rectovaginal: Deferred              Anus:  normal, no lesions  Arvell Birchwood, CMA present as chaperone.   Assessment/Plan:  63 y.o. G0 for annual exam.   Well female exam with routine gynecological exam - Education provided on SBEs, importance of preventative screenings, current guidelines, high calcium diet, regular exercise, and multivitamin daily. Labs with PCP.  Postmenopausal - no HRT, no bleeding  Vaginal dryness, menopausal - Plan: Estradiol  10 MCG TABS vaginal tablet twice weekly. Used this in the past and wants to restart. Painful intercourse. Using lubricant.   Cervical cancer screening - Plan: Cytology - PAP( [place]). Normal pap history. Pap today per guidelines.   Osteopenia, unspecified location - Plan: DG Bone Density. 2021 T-score -1.8 without elevated FRAX. Recommend Vitamin D  + Calcium supplement and regular exercise.   Lichen sclerosus - Using clobetasol  ointment as needed. Has not had to use much lately. PCP provided refills.  Screening for breast cancer - Normal mammogram history. Continue annual screenings. Normal breast exam today.   Screening for colon cancer - Negative Cologuard 08/2020. Cologuard ordered 05/20/24.  Return in about 1 year (around 06/03/2025) for Annual.       Andee Bamberger [facility], 9:43 AM 06/03/2024

## 2024-06-04 LAB — CYTOLOGY - PAP
Comment: NEGATIVE
Diagnosis: NEGATIVE
High risk HPV: NEGATIVE

## 2024-06-05 ENCOUNTER — Ambulatory Visit: Payer: Self-pay | Admitting: Radiology

## 2024-06-10 LAB — COLOGUARD: COLOGUARD: NEGATIVE

## 2024-08-06 ENCOUNTER — Other Ambulatory Visit (HOSPITAL_BASED_OUTPATIENT_CLINIC_OR_DEPARTMENT_OTHER)

## 2024-09-09 ENCOUNTER — Other Ambulatory Visit (HOSPITAL_BASED_OUTPATIENT_CLINIC_OR_DEPARTMENT_OTHER)

## 2024-10-14 ENCOUNTER — Other Ambulatory Visit (HOSPITAL_BASED_OUTPATIENT_CLINIC_OR_DEPARTMENT_OTHER)

## 2024-10-19 ENCOUNTER — Ambulatory Visit (HOSPITAL_BASED_OUTPATIENT_CLINIC_OR_DEPARTMENT_OTHER)
Admission: RE | Admit: 2024-10-19 | Discharge: 2024-10-19 | Disposition: A | Discharge status: Home / Self Care | Source: Ambulatory Visit | Attending: Nurse Practitioner | Admitting: Nurse Practitioner

## 2024-10-19 DIAGNOSIS — M8580 Other specified disorders of bone density and structure, unspecified site: Secondary | ICD-10-CM | POA: Diagnosis present
